# Patient Record
Sex: Male | Born: 1995 | Race: White | Hispanic: No | Marital: Single | Smoking: Current every day smoker
Health system: Southern US, Community
[De-identification: ages and names within clinical notes are randomized; demographics above are authoritative.]

## PROBLEM LIST (undated history)

## (undated) DIAGNOSIS — R51 Headache: Secondary | ICD-10-CM

## (undated) DIAGNOSIS — S36113A Laceration of liver, unspecified degree, initial encounter: Secondary | ICD-10-CM

## (undated) DIAGNOSIS — F99 Mental disorder, not otherwise specified: Secondary | ICD-10-CM

## (undated) DIAGNOSIS — H539 Unspecified visual disturbance: Secondary | ICD-10-CM

## (undated) DIAGNOSIS — J439 Emphysema, unspecified: Secondary | ICD-10-CM

## (undated) DIAGNOSIS — F902 Attention-deficit hyperactivity disorder, combined type: Secondary | ICD-10-CM

## (undated) DIAGNOSIS — R2241 Localized swelling, mass and lump, right lower limb: Secondary | ICD-10-CM

## (undated) DIAGNOSIS — Z973 Presence of spectacles and contact lenses: Secondary | ICD-10-CM

## (undated) DIAGNOSIS — F329 Major depressive disorder, single episode, unspecified: Secondary | ICD-10-CM

## (undated) DIAGNOSIS — F1910 Other psychoactive substance abuse, uncomplicated: Secondary | ICD-10-CM

## (undated) DIAGNOSIS — F32A Depression, unspecified: Secondary | ICD-10-CM

## (undated) DIAGNOSIS — S2619XA Other injury of heart without hemopericardium, initial encounter: Secondary | ICD-10-CM

## (undated) DIAGNOSIS — F1210 Cannabis abuse, uncomplicated: Secondary | ICD-10-CM

## (undated) DIAGNOSIS — T8484XA Pain due to internal orthopedic prosthetic devices, implants and grafts, initial encounter: Secondary | ICD-10-CM

## (undated) DIAGNOSIS — Z87820 Personal history of traumatic brain injury: Secondary | ICD-10-CM

## (undated) HISTORY — DX: Major depressive disorder, single episode, unspecified: F32.9

## (undated) HISTORY — PX: TONSILLECTOMY: SUR1361

## (undated) HISTORY — PX: ELBOW SURGERY: SHX618

## (undated) HISTORY — DX: Laceration of liver, unspecified degree, initial encounter: S36.113A

## (undated) HISTORY — DX: Mental disorder, not otherwise specified: F99

## (undated) HISTORY — PX: TYMPANOSTOMY TUBE PLACEMENT: SHX32

## (undated) HISTORY — DX: Depression, unspecified: F32.A

---
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
## 2013-02-21 ENCOUNTER — Emergency Department: Payer: Self-pay | Admitting: Emergency Medicine

## 2013-02-26 ENCOUNTER — Emergency Department: Payer: Self-pay | Admitting: Emergency Medicine

## 2013-05-05 ENCOUNTER — Emergency Department: Payer: Self-pay | Admitting: Emergency Medicine

## 2013-09-11 IMAGING — CT CT HEAD WITHOUT CONTRAST
2 series · 16 of 30 positions shown, 20 images · non-contrast
Comparison: none

REASON FOR EXAM: head injury
COMMENTS:

PROCEDURE:     CT  - CT HEAD WITHOUT CONTRAST  - January 08, 2012 [DATE]
RESULT:     Comparison:  12/20/2010
TECHNIQUE: Multiple axial images from the foramen magnum to the vertex were
obtained without IV contrast.

[Series 2: without · axial · non-contrast · 0.43mm/px · z∈[+262,+382]mm · 13 of 30 slices shown, 17 images]
[im 3/30  brain]
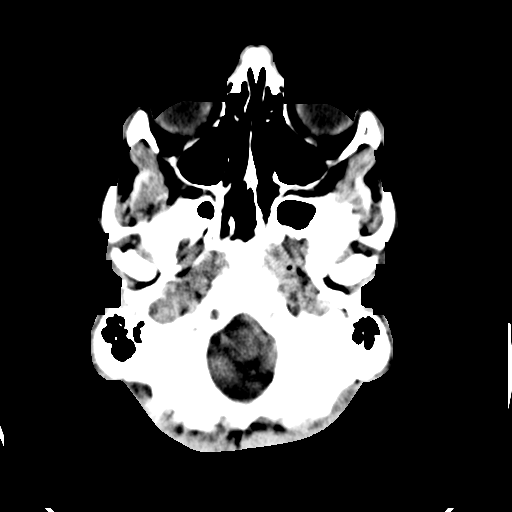
[im 3/30  bone]
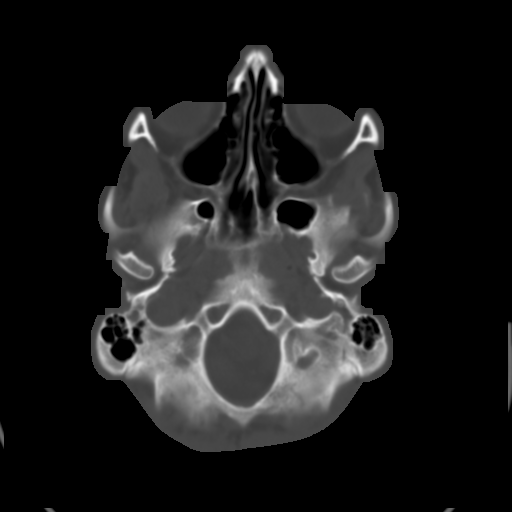
[im 5/30  brain]
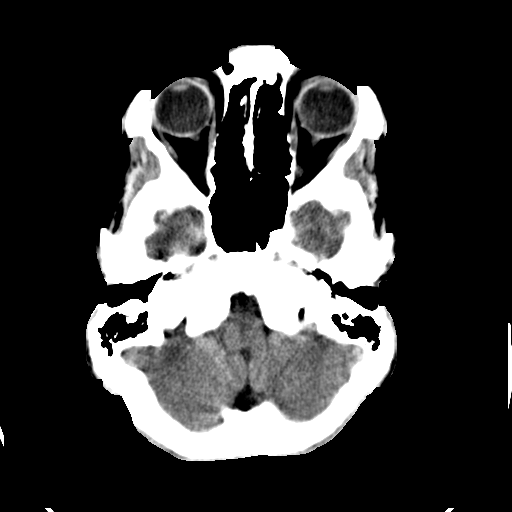
[im 7/30  brain]
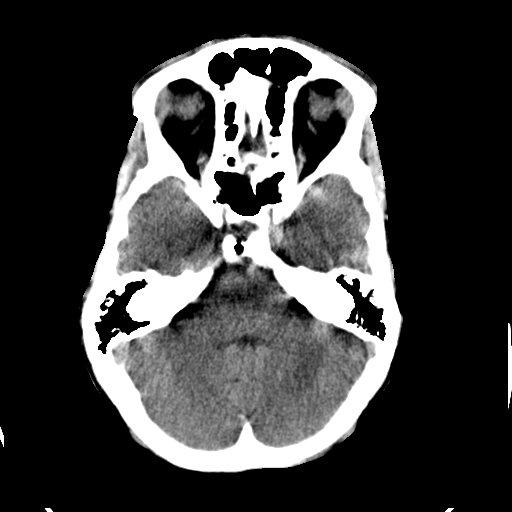
[im 9/30  brain]
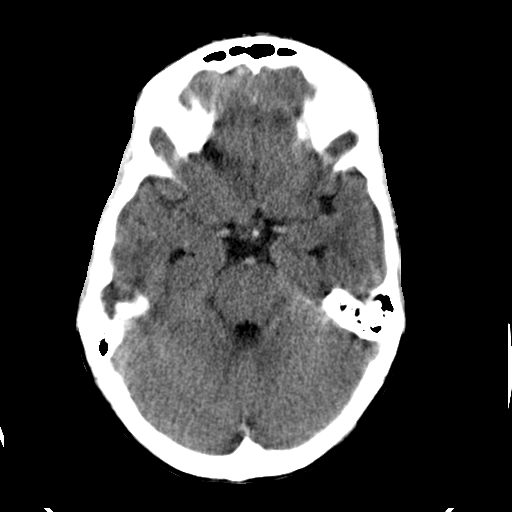
[im 11/30  brain]
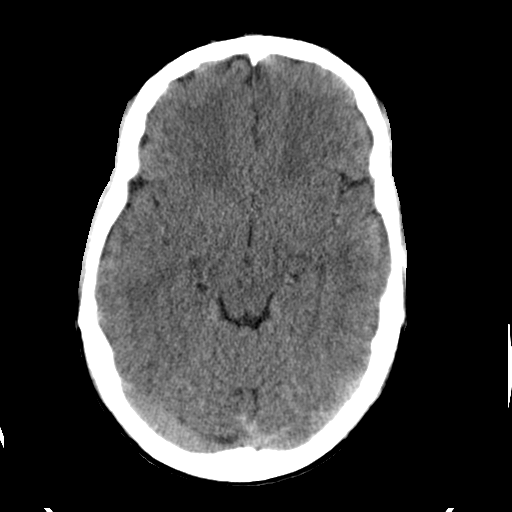
[im 11/30  bone]
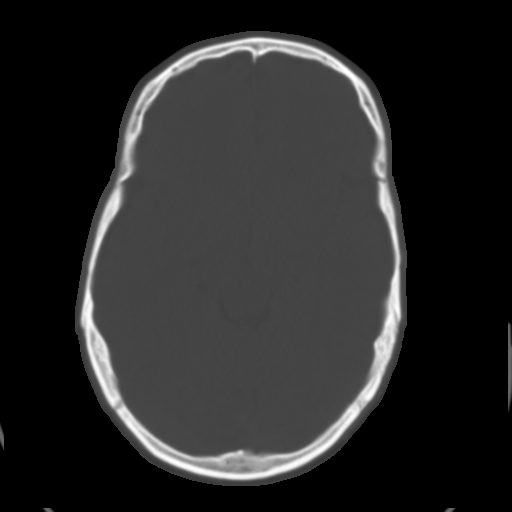
[im 13/30  brain]
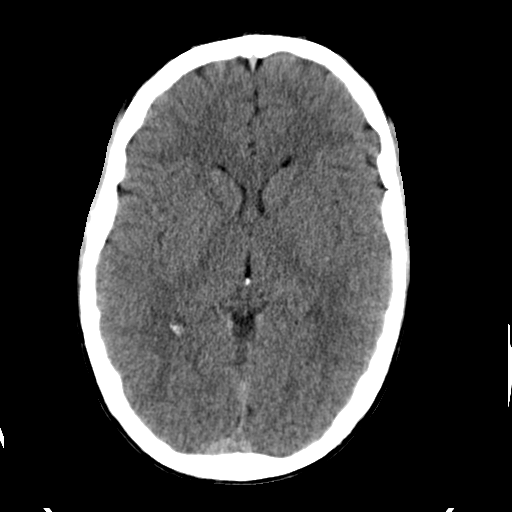
[im 15/30  brain]
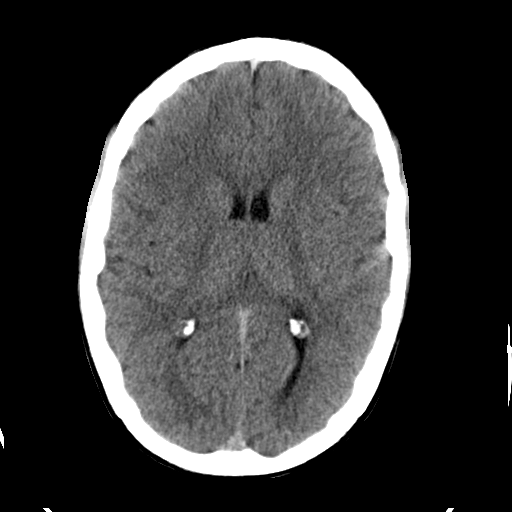
[im 17/30  brain]
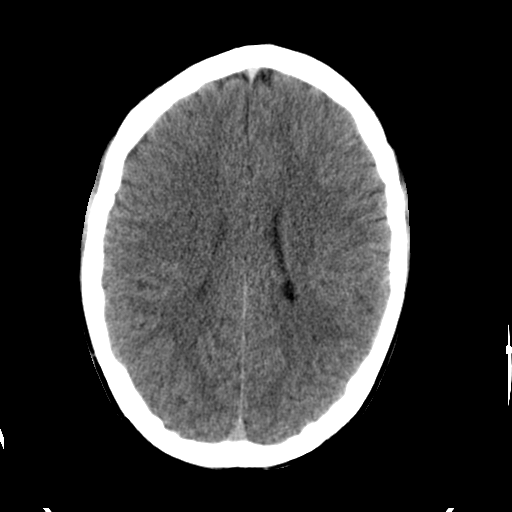
[im 19/30  brain]
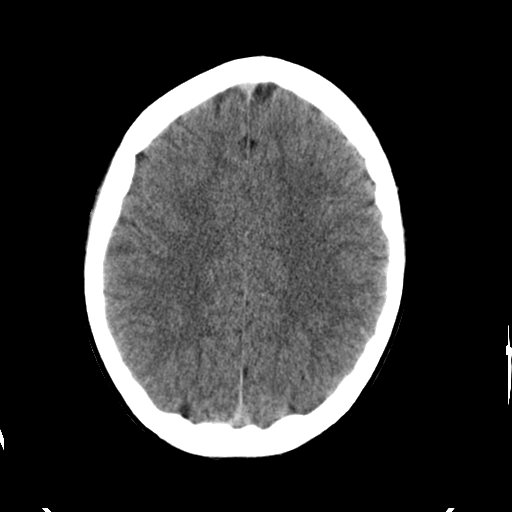
[im 19/30  bone]
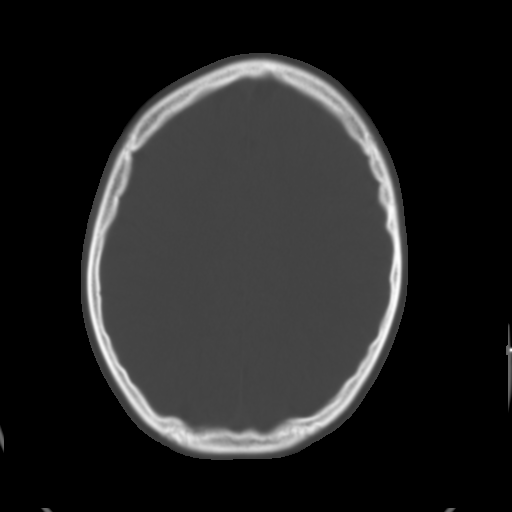
[im 21/30  brain]
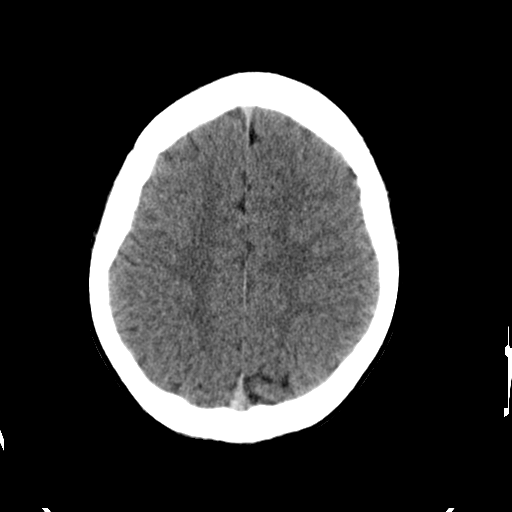
[im 23/30  brain]
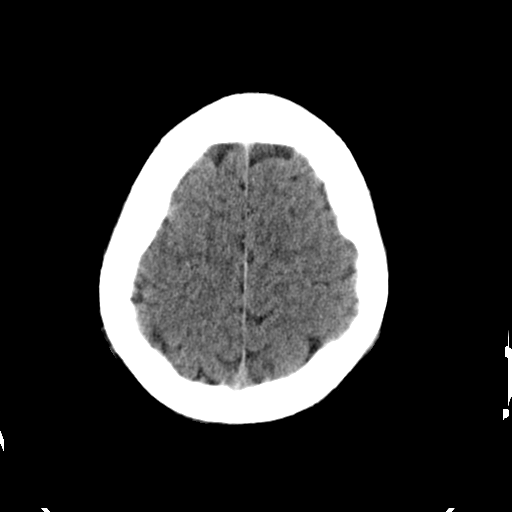
[im 25/30  brain]
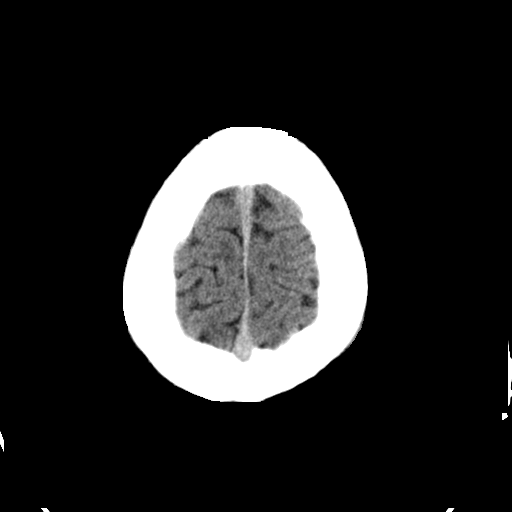
[im 27/30  brain]
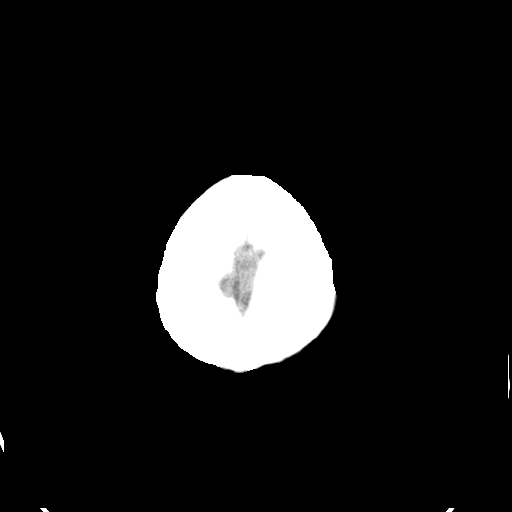
[im 27/30  bone]
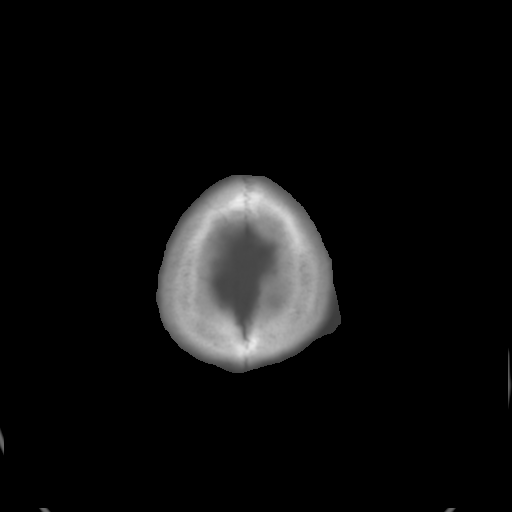

[Series 3: bone · axial · 0.43mm/px · z∈[+262,+302]mm · 3 of 30 slices shown]
[im 3/30  bone]
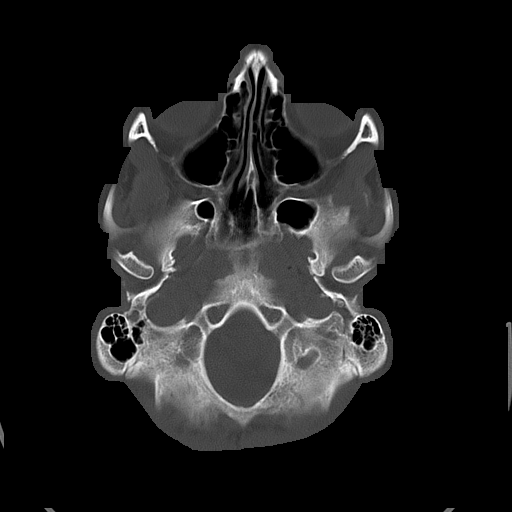
[im 7/30  bone]
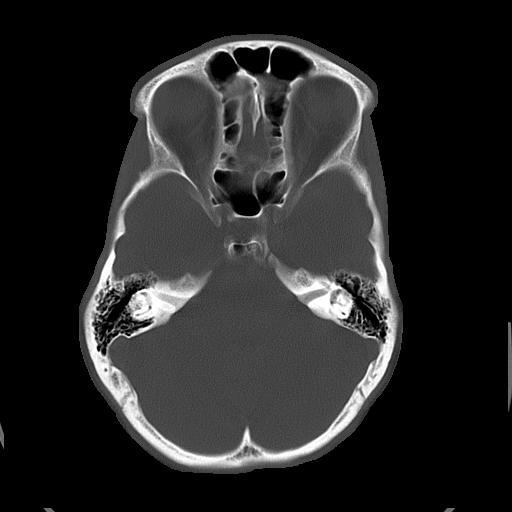
[im 11/30  bone]
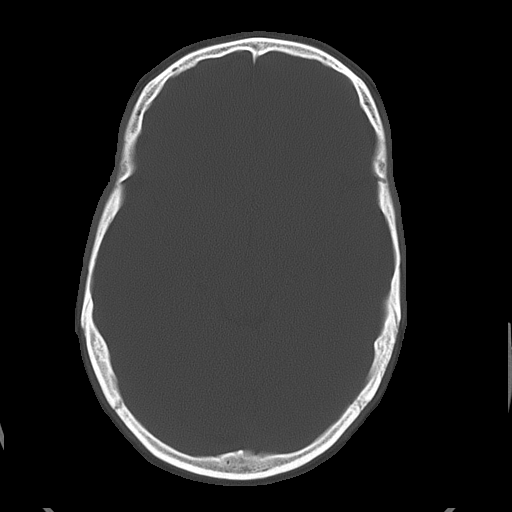

[16 of 30 positions shown; findings below may reference images not displayed]

FINDINGS: There is no evidence for mass effect, midline shift, or extra-axial fluid
collections. There is no evidence for space-occupying lesion, intracranial
hemorrhage, or cortical-based area of infarction.

The osseous structures are unremarkable.
IMPRESSION: No acute intracranial process.

## 2016-01-17 ENCOUNTER — Emergency Department
Admission: EM | Admit: 2016-01-17 | Discharge: 2016-01-17 | Disposition: A | Discharge status: Home / Self Care | Payer: Self-pay | Attending: Emergency Medicine | Admitting: Emergency Medicine

## 2016-01-17 ENCOUNTER — Encounter: Payer: Self-pay | Admitting: Emergency Medicine

## 2016-01-17 ENCOUNTER — Emergency Department: Payer: Self-pay

## 2016-01-17 DIAGNOSIS — M25572 Pain in left ankle and joints of left foot: Secondary | ICD-10-CM | POA: Insufficient documentation

## 2016-01-17 DIAGNOSIS — F902 Attention-deficit hyperactivity disorder, combined type: Secondary | ICD-10-CM | POA: Insufficient documentation

## 2016-01-17 DIAGNOSIS — Y929 Unspecified place or not applicable: Secondary | ICD-10-CM | POA: Insufficient documentation

## 2016-01-17 DIAGNOSIS — Y990 Civilian activity done for income or pay: Secondary | ICD-10-CM | POA: Insufficient documentation

## 2016-01-17 DIAGNOSIS — X501XXA Overexertion from prolonged static or awkward postures, initial encounter: Secondary | ICD-10-CM | POA: Insufficient documentation

## 2016-01-17 DIAGNOSIS — F17210 Nicotine dependence, cigarettes, uncomplicated: Secondary | ICD-10-CM | POA: Insufficient documentation

## 2016-01-17 DIAGNOSIS — F331 Major depressive disorder, recurrent, moderate: Secondary | ICD-10-CM | POA: Insufficient documentation

## 2016-01-17 DIAGNOSIS — Y9372 Activity, wrestling: Secondary | ICD-10-CM | POA: Insufficient documentation

## 2016-01-17 MED ORDER — NAPROXEN 500 MG PO TABS: 500.0000 mg | ORAL_TABLET | Freq: Two times a day (BID) | ORAL | Status: DC

## 2016-01-17 NOTE — ED Provider Notes (Signed)
[facility] Emergency Department Provider Note  ____________________________________________  Time seen: Approximately 9:35 AM  I have reviewed the triage vital signs and the nursing notes.   HISTORY  Chief Complaint Ankle Pain    HPI Mathew Herrera is a 20 y.o. male presents emergency room for evaluation of left ankle pain. Patient reports that he was wrestling yesterday and twisted his left ankle. Describes pain as minimal to over 10 nonradiating. Essentially, patient was noticed limping at work and his supervisor sent him over here for evaluation and medical clearance to return to work.   Past Medical History  Diagnosis Date  . Mental disorder   . Depression   . Headache(784.0)   . Vision abnormalities   . Attention deficit hyperactivity disorder, combined type 01/08/2012    Patient Active Problem List   Diagnosis Date Noted  . Moderate recurrent major depression (HCC) 01/08/2012  . Oppositional defiant disorder 01/08/2012  . Attention deficit hyperactivity disorder, combined type 01/08/2012  . Cannabis dependence (HCC) 01/08/2012  . Polysubstance abuse 01/08/2012    Past Surgical History  Procedure Laterality Date  . Elbow surgery  age 1    left    Current Outpatient Rx  Name  Route  Sig  Dispense  Refill  . naproxen (NAPROSYN) 500 MG tablet   Oral   Take 1 tablet (500 mg total) by mouth 2 (two) times daily with a meal.   60 tablet   0     Allergies Review of patient's allergies indicates no known allergies.  Family History  Problem Relation Age of Onset  . Depression Mother     Social History Social History  Substance Use Topics  . Smoking status: Current Every Day Smoker -- 1.00 packs/day for 4 years    Types: Cigarettes  . Smokeless tobacco: None     Comment: Plans to remain abstinent after discharge  . Alcohol Use: 12.0 oz/week    20 Shots of liquor per week     Comment: occasional    Review of  Systems Constitutional: No fever/chills Cardiovascular: Denies chest pain. Respiratory: Denies shortness of breath. Musculoskeletal: Negative for back pain. Skin: Negative for rash. Neurological: Negative for headaches, focal weakness or numbness.  10-point ROS otherwise negative.  ____________________________________________   PHYSICAL EXAM:  VITAL SIGNS: ED Triage Vitals  Enc Vitals Group     BP 01/17/16 0932 158/71 mmHg     Pulse Rate 01/17/16 0932 61     Resp 01/17/16 0932 16     Temp 01/17/16 0932 97.9 F (36.6 C)     Temp Source 01/17/16 0932 Oral     SpO2 01/17/16 0932 99 %     Weight 01/17/16 0932 145 lb (65.772 kg)     Height 01/17/16 0932 6' (1.829 m)     Head Cir --      Peak Flow --      Pain Score 01/17/16 0933 2     Pain Loc --      Pain Edu? --      Excl. in GC? --     Constitutional: Alert and oriented. Well appearing and in no acute distress.   Cardiovascular: Normal rate, regular rhythm. Grossly normal heart sounds.  Good peripheral circulation. Respiratory: Normal respiratory effort.  No retractions. Lungs CTAB. Musculoskeletal: No lower extremity tenderness nor edema.  No joint effusions. Neurologic:  Normal speech and language. No gross focal neurologic deficits are appreciated. No gait instability. Skin:  Skin is warm, dry  and intact. No rash noted. Psychiatric: Mood and affect are normal. Speech and behavior are normal.  ____________________________________________   LABS (all labs ordered are listed, but only abnormal results are displayed)  Labs Reviewed - No data to display ____________________________________________  EKG   ____________________________________________  RADIOLOGY  No acute osseous findings. ____________________________________________   PROCEDURES  Procedure(s) performed: None  Critical Care performed: No  ____________________________________________   INITIAL IMPRESSION / ASSESSMENT AND PLAN / ED  COURSE  Pertinent labs & imaging results that were available during my care of the patient were reviewed by me and considered in my medical decision making (see chart for details).  Left ankle pain and swelling. Patient discharged back to work with work note to justify his ability to work. No restrictions return to work as normal. Patient voices no other emergency medical complaints and was given a prescription for Naprosyn to take as needed for pain or swelling. ____________________________________________   FINAL CLINICAL IMPRESSION(S) / ED DIAGNOSES  Final diagnoses:  Left ankle pain     This chart was dictated using voice recognition software/Dragon. Despite best efforts to proofread, errors can occur which can change the meaning. Any change was purely unintentional.   Evangeline Dakinharles M Beers, PA-C 01/17/16 1052  Charlynne Panderavid Hsienta Yao, MD 01/17/16 [phone]

## 2016-01-17 NOTE — ED Notes (Signed)
Reports wrestling yesterday and twisted left ankle

## 2016-01-17 NOTE — Discharge Instructions (Signed)
Ankle Pain °Ankle pain is a common symptom. The bones, cartilage, tendons, and muscles of the ankle joint perform a lot of work each day. The ankle joint holds your body weight and allows you to move around. Ankle pain can occur on either side or back of 1 or both ankles. Ankle pain may be sharp and burning or dull and aching. There may be tenderness, stiffness, redness, or warmth around the ankle. The pain occurs more often when a person walks or puts pressure on the ankle. °CAUSES  °There are many reasons ankle pain can develop. It is important to work with your caregiver to identify the cause since many conditions can impact the bones, cartilage, muscles, and tendons. Causes for ankle pain include: °· Injury, including a break (fracture), sprain, or strain often due to a fall, sports, or a high-impact activity. °· Swelling (inflammation) of a tendon (tendonitis). °· Achilles tendon rupture. °· Ankle instability after repeated sprains and strains. °· Poor foot alignment. °· Pressure on a nerve (tarsal tunnel syndrome). °· Arthritis in the ankle or the lining of the ankle. °· Crystal formation in the ankle (gout or pseudogout). °DIAGNOSIS  °A diagnosis is based on your medical history, your symptoms, results of your physical exam, and results of diagnostic tests. Diagnostic tests may include X-ray exams or a computerized magnetic scan (magnetic resonance imaging, MRI). °TREATMENT  °Treatment will depend on the cause of your ankle pain and may include: °· Keeping pressure off the ankle and limiting activities. °· Using crutches or other walking support (a cane or brace). °· Using rest, ice, compression, and elevation. °· Participating in physical therapy or home exercises. °· Wearing shoe inserts or special shoes. °· Losing weight. °· Taking medications to reduce pain or swelling or receiving an injection. °· Undergoing surgery. °HOME CARE INSTRUCTIONS  °· Only take over-the-counter or prescription medicines for  pain, discomfort, or fever as directed by your caregiver. °· Put ice on the injured area. °· Put ice in a plastic bag. °· Place a towel between your skin and the bag. °· Leave the ice on for 15-20 minutes at a time, 03-04 times a day. °· Keep your leg raised (elevated) when possible to lessen swelling. °· Avoid activities that cause ankle pain. °· Follow specific exercises as directed by your caregiver. °· Record how often you have ankle pain, the location of the pain, and what it feels like. This information may be helpful to you and your caregiver. °· Ask your caregiver about returning to work or sports and whether you should drive. °· Follow up with your caregiver for further examination, therapy, or testing as directed. °SEEK MEDICAL CARE IF:  °· Pain or swelling continues or worsens beyond 1 week. °· You have an oral temperature above 102° F (38.9° C). °· You are feeling unwell or have chills. °· You are having an increasingly difficult time with walking. °· You have loss of sensation or other new symptoms. °· You have questions or concerns. °MAKE SURE YOU:  °· Understand these instructions. °· Will watch your condition. °· Will get help right away if you are not doing well or get worse. °  °This information is not intended to replace advice given to you by your health care provider. Make sure you discuss any questions you have with your health care provider. °  °Document Released: 12/06/2009 Document Revised: 09/10/2011 Document Reviewed: 01/18/2015 °Elsevier Interactive Patient Education ©2016 [employer] ° °Cryotherapy °Cryotherapy is when you put ice on   your injury. Ice helps lessen pain and puffiness (swelling) after an injury. Ice works the best when you start using it in the first 24 to 48 hours after an injury. °HOME CARE °· Put a dry or damp towel between the ice pack and your skin. °· You may press gently on the ice pack. °· Leave the ice on for no more than 10 to 20 minutes at a time. °· Check your  skin after 5 minutes to make sure your skin is okay. °· Rest at least 20 minutes between ice pack uses. °· Stop using ice when your skin loses feeling (numbness). °· Do not use ice on someone who cannot tell you when it hurts. This includes small children and people with memory problems (dementia). °GET HELP RIGHT AWAY IF: °· You have white spots on your skin. °· Your skin turns blue or pale. °· Your skin feels waxy or hard. °· Your puffiness gets worse. °MAKE SURE YOU:  °· Understand these instructions. °· Will watch your condition. °· Will get help right away if you are not doing well or get worse. °  °This information is not intended to replace advice given to you by your health care provider. Make sure you discuss any questions you have with your health care provider. °  °Document Released: 12/05/2007 Document Revised: 09/10/2011 Document Reviewed: 02/08/2011 °Elsevier Interactive Patient Education ©2016 Elsevier Inc. ° °

## 2016-04-23 ENCOUNTER — Emergency Department: Payer: Self-pay

## 2016-04-23 ENCOUNTER — Emergency Department
Admission: EM | Admit: 2016-04-23 | Discharge: 2016-04-23 | Disposition: A | Discharge status: Home / Self Care | Payer: Self-pay | Attending: Emergency Medicine | Admitting: Emergency Medicine

## 2016-04-23 ENCOUNTER — Encounter: Payer: Self-pay | Admitting: Emergency Medicine

## 2016-04-23 DIAGNOSIS — Y999 Unspecified external cause status: Secondary | ICD-10-CM | POA: Insufficient documentation

## 2016-04-23 DIAGNOSIS — Y9289 Other specified places as the place of occurrence of the external cause: Secondary | ICD-10-CM | POA: Insufficient documentation

## 2016-04-23 DIAGNOSIS — F17210 Nicotine dependence, cigarettes, uncomplicated: Secondary | ICD-10-CM | POA: Insufficient documentation

## 2016-04-23 DIAGNOSIS — S0083XA Contusion of other part of head, initial encounter: Secondary | ICD-10-CM

## 2016-04-23 DIAGNOSIS — W01198A Fall on same level from slipping, tripping and stumbling with subsequent striking against other object, initial encounter: Secondary | ICD-10-CM | POA: Insufficient documentation

## 2016-04-23 DIAGNOSIS — Y9389 Activity, other specified: Secondary | ICD-10-CM | POA: Insufficient documentation

## 2016-04-23 DIAGNOSIS — S0181XA Laceration without foreign body of other part of head, initial encounter: Secondary | ICD-10-CM

## 2016-04-23 DIAGNOSIS — F909 Attention-deficit hyperactivity disorder, unspecified type: Secondary | ICD-10-CM | POA: Insufficient documentation

## 2016-04-23 DIAGNOSIS — S0541XA Penetrating wound of orbit with or without foreign body, right eye, initial encounter: Secondary | ICD-10-CM | POA: Insufficient documentation

## 2016-04-23 MED ORDER — LIDOCAINE-EPINEPHRINE (PF) 1 %-1:200000 IJ SOLN
INTRAMUSCULAR | Status: AC
  Filled 2016-04-23: qty 30

## 2016-04-23 MED ORDER — OXYCODONE-ACETAMINOPHEN 5-325 MG PO TABS
1.0000 | ORAL_TABLET | Freq: Once | ORAL | Status: AC
  Administered 2016-04-23: 1 via ORAL
  Filled 2016-04-23: qty 1

## 2016-04-23 MED ORDER — IBUPROFEN 600 MG PO TABS
600.0000 mg | ORAL_TABLET | Freq: Once | ORAL | Status: AC
  Administered 2016-04-23: 600 mg via ORAL
  Filled 2016-04-23: qty 1

## 2016-04-23 MED ORDER — OXYCODONE-ACETAMINOPHEN 5-325 MG PO TABS: 1.0000 | ORAL_TABLET | Freq: Four times a day (QID) | ORAL | 0 refills | Status: DC | PRN

## 2016-04-23 NOTE — ED Provider Notes (Signed)
[facility] Emergency Department Provider Note   ____________________________________________   First MD Initiated Contact with Patient 04/23/16 1225     (approximate)  I have reviewed the triage vital signs and the nursing notes.   HISTORY  Chief Complaint Facial Injury    HPI Mathew Herrera is a 20 y.o. male patient percent with a laceration and facial contusion secondary to a slip and fall. Patient denies loss of consciousness. Patient lacerations as above the right eyebrow and right inferior periorbital area. Edema is apparent right inferior orbital area. Patient rates his pain as a 7/10. No palliative measures prior to arrival.  Past Medical History:  Diagnosis Date  . Attention deficit hyperactivity disorder, combined type 01/08/2012  . Depression   . Headache(784.0)   . Mental disorder   . Vision abnormalities     Patient Active Problem List   Diagnosis Date Noted  . Moderate recurrent major depression (HCC) 01/08/2012  . Oppositional defiant disorder 01/08/2012  . Attention deficit hyperactivity disorder, combined type 01/08/2012  . Cannabis dependence (HCC) 01/08/2012  . Polysubstance abuse 01/08/2012    Past Surgical History:  Procedure Laterality Date  . ELBOW SURGERY  age 20   left    Prior to Admission medications   Medication Sig Start Date End Date Taking? Authorizing Provider  naproxen (NAPROSYN) 500 MG tablet Take 1 tablet (500 mg total) by mouth 2 (two) times daily with a meal. 01/17/16   Evangeline Dakinharles M Beers, PA-C  oxyCODONE-acetaminophen (ROXICET) 5-325 MG tablet Take 1 tablet by mouth every 6 (six) hours as needed for severe pain. 04/23/16   Joni Reiningonald K Smith, PA-C    Allergies Review of patient's allergies indicates no known allergies.  Family History  Problem Relation Age of Onset  . Depression Mother     Social History Social History  Substance Use Topics  . Smoking status: Current Every Day Smoker    Packs/day: 1.00     Years: 4.00    Types: Cigarettes  . Smokeless tobacco: Never Used     Comment: Plans to remain abstinent after discharge  . Alcohol use 12.0 oz/week    20 Shots of liquor per week     Comment: occasional    Review of Systems Constitutional: No fever/chills Eyes: No visual changes. ENT: No sore throat. Cardiovascular: Denies chest pain. Respiratory: Denies shortness of breath. Gastrointestinal: No abdominal pain.  No nausea, no vomiting.  No diarrhea.  No constipation. Genitourinary: Negative for dysuria. Musculoskeletal: Negative for back pain. Skin: Negative for rash. Neurological: Positive for headaches, but denies focal weakness or numbness. Psychiatric:ADHD  ____________________________________________   PHYSICAL EXAM:  VITAL SIGNS: ED Triage Vitals [04/23/16 1202]  Enc Vitals Group     BP      Pulse      Resp      Temp      Temp src      SpO2      Weight 152 lb (68.9 kg)     Height 5\' 9"  (1.753 m)     Head Circumference      Peak Flow      Pain Score 7     Pain Loc      Pain Edu?      Excl. in GC?     Constitutional: Alert and oriented. Well appearing and in no acute distress. Eyes: Conjunctivae are normal. PERRL. EOMI. Head: Atraumatic. Nose: No congestion/rhinnorhea. Mouth/Throat: Mucous membranes are moist.  Oropharynx non-erythematous. Neck: No stridor.  No cervical spine tenderness to palpation. Hematological/Lymphatic/Immunilogical: No cervical lymphadenopathy. Cardiovascular: Normal rate, regular rhythm. Grossly normal heart sounds.  Good peripheral circulation. Respiratory: Normal respiratory effort.  No retractions. Lungs CTAB. Gastrointestinal: Soft and nontender. No distention. No abdominal bruits. No CVA tenderness. Musculoskeletal: No lower extremity tenderness nor edema.  No joint effusions. Neurologic:  Normal speech and language. No gross focal neurologic deficits are appreciated. No gait instability. Skin:  Skin is warm, dry and  intact. No rash noted. Psychiatric: Mood and affect are normal. Speech and behavior are normal.  ____________________________________________   LABS (all labs ordered are listed, but only abnormal results are displayed)  Labs Reviewed - No data to display ____________________________________________  EKG   ____________________________________________  RADIOLOGY  No fractures per x-ray and CT of the face. ____________________________________________   PROCEDURES  Procedure(s) performed: LACERATION REPAIR Performed by: Joni Reining Authorized by: Joni Reining Consent: Verbal consent obtained. Risks and benefits: risks, benefits and alternatives were discussed Consent given by: patient Patient identity confirmed: provided demographic data Prepped and Draped in normal sterile fashion Wound explored  Laceration Location: Supraobital  and inferior right orbital area.  Laceration Length: First laceration 2 cm and second laceration 2.5 cm  No Foreign Bodies seen or palpated  Anesthesia: local infiltration  Local anesthetic: lidocaine 1% with epinephrine  Anesthetic total: 6 ml  Irrigation method: syringe Amount of cleaning: standard  Skin closure: 5-0 nylon   Number of sutures: Super orbital laceration required 7 sutures and inferior orbital laceration required 6 sutures. Technique: Interrupted   Patient tolerance: Patient tolerated the procedure well with no immediate complications.    Procedures  Critical Care performed: No  ____________________________________________   INITIAL IMPRESSION / ASSESSMENT AND PLAN / ED COURSE  Pertinent labs & imaging results that were available during my care of the patient were reviewed by me and considered in my medical decision making (see chart for details). Facial contusion or laceration. Discuss CT and x-ray results with patient. Patient given discharge care instructions. Patient get a prescription for Percocets.  Patient advised return in 5 days for suture removal.  Clinical Course     ____________________________________________   FINAL CLINICAL IMPRESSION(S) / ED DIAGNOSES  Final diagnoses:  Facial laceration, initial encounter  Facial contusion, initial encounter      NEW MEDICATIONS STARTED DURING THIS VISIT:  New Prescriptions   OXYCODONE-ACETAMINOPHEN (ROXICET) 5-325 MG TABLET    Take 1 tablet by mouth every 6 (six) hours as needed for severe pain.     Note:  This document was prepared using Dragon voice recognition software and may include unintentional dictation errors.    Joni Reining, PA-C 04/23/16 1415    Minna Antis, MD 04/23/16 [phone]

## 2016-04-23 NOTE — ED Triage Notes (Signed)
First Nurse:  Patient slipped on a rug at home and fell into counter top hitting above and below right eye.  Lacerations to right cheek bone and right eye lid.  Bleeding controlled.  Denies LOC.. Bleeding controlled.

## 2016-04-23 NOTE — ED Notes (Signed)
See triage note  States he fell about 30 mins PTA and hit face on counter  No LOC lacerations noted to right cheek bone and eyelid

## 2019-12-07 ENCOUNTER — Encounter: Payer: Self-pay | Admitting: Emergency Medicine

## 2019-12-07 ENCOUNTER — Other Ambulatory Visit: Payer: Self-pay

## 2019-12-07 DIAGNOSIS — K0889 Other specified disorders of teeth and supporting structures: Secondary | ICD-10-CM | POA: Insufficient documentation

## 2019-12-07 DIAGNOSIS — F17210 Nicotine dependence, cigarettes, uncomplicated: Secondary | ICD-10-CM | POA: Insufficient documentation

## 2019-12-07 DIAGNOSIS — Z79899 Other long term (current) drug therapy: Secondary | ICD-10-CM | POA: Insufficient documentation

## 2019-12-07 NOTE — Telephone Encounter (Signed)
This note was not done by me and I am unable to address it further.

## 2019-12-07 NOTE — ED Triage Notes (Signed)
Patient ambulatory to triage with steady gait, without difficulty or distress noted, pt reports rt sided dental pain x 2wks

## 2019-12-08 ENCOUNTER — Emergency Department
Admission: EM | Admit: 2019-12-08 | Discharge: 2019-12-08 | Disposition: A | Discharge status: Home / Self Care | Payer: Self-pay | Attending: Emergency Medicine | Admitting: Emergency Medicine

## 2019-12-08 DIAGNOSIS — K0889 Other specified disorders of teeth and supporting structures: Secondary | ICD-10-CM

## 2019-12-08 MED ORDER — HYDROCODONE-ACETAMINOPHEN 5-325 MG PO TABS: 1.0000 | ORAL_TABLET | ORAL | 0 refills | Status: AC | PRN

## 2019-12-08 MED ORDER — AMOXICILLIN-POT CLAVULANATE 875-125 MG PO TABS: 1.0000 | ORAL_TABLET | Freq: Two times a day (BID) | ORAL | 0 refills | Status: AC

## 2019-12-08 MED ORDER — IBUPROFEN 600 MG PO TABS: 600.0000 mg | ORAL_TABLET | Freq: Four times a day (QID) | ORAL | 0 refills | Status: DC | PRN

## 2019-12-08 NOTE — ED Provider Notes (Signed)
[facility] Emergency Department Provider Note ____________________________________________   First MD Initiated Contact with Patient 12/08/19 0310     (approximate)  I have reviewed the triage vital signs and the nursing notes.   HISTORY  Chief Complaint Dental Pain    HPI Mathew Herrera is a 24 y.o. male with PMH as noted below who presents with dental pain over the last 2 weeks, gradual onset, and mainly localized to 2 teeth on the right side, both molars, 1 upper and 1 lower.  The patient denies any swelling, fever chills, or pus drainage.  Past Medical History:  Diagnosis Date  . Attention deficit hyperactivity disorder, combined type 01/08/2012  . Depression   . Headache(784.0)   . Mental disorder   . Vision abnormalities     Patient Active Problem List   Diagnosis Date Noted  . Moderate recurrent major depression ([place]) 01/08/2012  . Oppositional defiant disorder 01/08/2012  . Attention deficit hyperactivity disorder, combined type 01/08/2012  . Cannabis dependence ([place]) 01/08/2012  . Polysubstance abuse ([place]) 01/08/2012    Past Surgical History:  Procedure Laterality Date  . ELBOW SURGERY  age 49   left    Prior to Admission medications   Medication Sig Start Date End Date Taking? Authorizing Provider  amoxicillin-clavulanate (AUGMENTIN) 875-125 MG tablet Take 1 tablet by mouth 2 (two) times daily for 7 days. 12/08/19 12/15/19  Arta Silence, MD  HYDROcodone-acetaminophen (NORCO/VICODIN) 5-325 MG tablet Take 1 tablet by mouth every 4 (four) hours as needed for up to 5 days for severe pain. 12/08/19 12/13/19  Arta Silence, MD  ibuprofen (ADVIL) 600 MG tablet Take 1 tablet (600 mg total) by mouth every 6 (six) hours as needed. 12/08/19   Arta Silence, MD  naproxen (NAPROSYN) 500 MG tablet Take 1 tablet (500 mg total) by mouth 2 (two) times daily with a meal. 01/17/16   Beers, Pierce Crane, PA-C  oxyCODONE-acetaminophen (ROXICET)  5-325 MG tablet Take 1 tablet by mouth every 6 (six) hours as needed for severe pain. 04/23/16   Sable Feil, PA-C  buPROPion (WELLBUTRIN XL) 300 MG 24 hr tablet Take 1 tablet (300 mg total) by mouth daily. 01/14/12 01/17/16  Winson, Manus Rudd, NP  QUEtiapine (SEROQUEL) 100 MG tablet Take 1 tablet (100 mg total) by mouth at bedtime. 01/14/12 01/17/16  Aurelio Jew, NP    Allergies Patient has no known allergies.  Family History  Problem Relation Age of Onset  . Depression Mother     Social History Social History   Tobacco Use  . Smoking status: Current Every Day Smoker    Packs/day: 1.00    Years: 4.00    Pack years: 4.00    Types: Cigarettes  . Smokeless tobacco: Never Used  . Tobacco comment: Plans to remain abstinent after discharge  Substance Use Topics  . Alcohol use: Yes    Alcohol/week: 20.0 standard drinks    Types: 20 Shots of liquor per week    Comment: occasional  . Drug use: Yes    Frequency: 7.0 times per week    Types: Cocaine, Marijuana, Methamphetamines, Amphetamines, Benzodiazepines, "Crack" cocaine, Hydrocodone, LSD, MDMA (Ecstacy), Psilocybin    Review of Systems  Constitutional: No fever. ENT: No sore throat. Gastrointestinal: No vomiting. Skin: Negative for rash. Neurological: Negative for headache.   ____________________________________________   PHYSICAL EXAM:  VITAL SIGNS: ED Triage Vitals  Enc Vitals Group     BP 12/07/19 2308 138/82     Pulse  Rate 12/07/19 2308 71     Resp 12/07/19 2308 18     Temp 12/07/19 2308 97.9 F (36.6 C)     Temp Source 12/07/19 2308 Oral     SpO2 12/07/19 2308 100 %     Weight 12/07/19 2320 153 lb (69.4 kg)     Height 12/07/19 2320 5\' 10"  (1.778 m)     Head Circumference --      Peak Flow --      Pain Score 12/07/19 2320 3     Pain Loc --      Pain Edu? --      Excl. in GC? --     Constitutional: Alert and oriented. Well appearing and in no acute distress. Eyes: Conjunctivae are normal.  Head:  Atraumatic. Nose: No congestion/rhinnorhea. Mouth/Throat: Mucous membranes are moist.  Oropharynx clear.  1 upper and 1 lower molar on the right with visible cavities.  No surrounding erythema or fluctuance.  No drainage. Neck: Normal range of motion.  Cardiovascular: Normal rate, regular rhythm. Good peripheral circulation. Respiratory: Normal respiratory effort.  No retractions. Gastrointestinal: No distention.  Musculoskeletal: Extremities warm and well perfused.  Neurologic:  Normal speech and language. No gross focal neurologic deficits are appreciated.  Skin:  Skin is warm and dry. No rash noted. Psychiatric: Mood and affect are normal. Speech and behavior are normal.  ____________________________________________   LABS (all labs ordered are listed, but only abnormal results are displayed)  Labs Reviewed - No data to display ____________________________________________  EKG   ____________________________________________  RADIOLOGY    ____________________________________________   PROCEDURES  Procedure(s) performed: No  Procedures  Critical Care performed: No ____________________________________________   INITIAL IMPRESSION / ASSESSMENT AND PLAN / ED COURSE  Pertinent labs & imaging results that were available during my care of the patient were reviewed by me and considered in my medical decision making (see chart for details).  24 year old male with PMH as noted above presents with dental pain, affecting to molars on the right side.  He has visible cavities there.  On exam he is overall well-appearing with normal vital signs.  He is afebrile.  The pain is subacute.  There is no evidence of abscess or indication for acute intervention in the ED.  He will need dental follow-up.  I will put him on Augmentin for possible developing dental infection and give pain medication.  Return precautions given, and he expresses  understanding.  ____________________________________________   FINAL CLINICAL IMPRESSION(S) / ED DIAGNOSES  Final diagnoses:  Pain, dental      NEW MEDICATIONS STARTED DURING THIS VISIT:  New Prescriptions   AMOXICILLIN-CLAVULANATE (AUGMENTIN) 875-125 MG TABLET    Take 1 tablet by mouth 2 (two) times daily for 7 days.   HYDROCODONE-ACETAMINOPHEN (NORCO/VICODIN) 5-325 MG TABLET    Take 1 tablet by mouth every 4 (four) hours as needed for up to 5 days for severe pain.   IBUPROFEN (ADVIL) 600 MG TABLET    Take 1 tablet (600 mg total) by mouth every 6 (six) hours as needed.     Note:  This document was prepared using Dragon voice recognition software and may include unintentional dictation errors.   22, MD 12/08/19 [phone]

## 2019-12-08 NOTE — Discharge Instructions (Signed)
Take the Augmentin as prescribed and finish the full 7-day course.  You should take ibuprofen 600 mg every 6 hours as needed for pain, and you may take the hydrocodone if needed for more severe breakthrough pain.  Follow-up with a dentist within the next week.  A list of dental clinics in the area has been provided.

## 2021-12-29 ENCOUNTER — Encounter (HOSPITAL_COMMUNITY): Payer: Self-pay

## 2021-12-29 ENCOUNTER — Inpatient Hospital Stay (HOSPITAL_COMMUNITY): Payer: Self-pay

## 2021-12-29 ENCOUNTER — Other Ambulatory Visit: Payer: Self-pay

## 2021-12-29 ENCOUNTER — Emergency Department (HOSPITAL_COMMUNITY): Payer: Self-pay

## 2021-12-29 ENCOUNTER — Inpatient Hospital Stay (HOSPITAL_COMMUNITY): Payer: Self-pay | Admitting: Anesthesiology

## 2021-12-29 ENCOUNTER — Inpatient Hospital Stay (HOSPITAL_COMMUNITY)
Admission: EM | Admit: 2021-12-29 | Discharge: 2022-01-01 | DRG: 958 | Disposition: A | Discharge status: Home / Self Care | Payer: Self-pay | Attending: General Surgery | Admitting: General Surgery

## 2021-12-29 ENCOUNTER — Inpatient Hospital Stay (HOSPITAL_COMMUNITY): Admission: EM | Disposition: A | Discharge status: Home / Self Care | Payer: Self-pay | Source: Home / Self Care

## 2021-12-29 DIAGNOSIS — Z789 Other specified health status: Secondary | ICD-10-CM

## 2021-12-29 DIAGNOSIS — F17210 Nicotine dependence, cigarettes, uncomplicated: Secondary | ICD-10-CM | POA: Diagnosis present

## 2021-12-29 DIAGNOSIS — S27322A Contusion of lung, bilateral, initial encounter: Secondary | ICD-10-CM | POA: Diagnosis present

## 2021-12-29 DIAGNOSIS — S12500A Unspecified displaced fracture of sixth cervical vertebra, initial encounter for closed fracture: Secondary | ICD-10-CM | POA: Diagnosis present

## 2021-12-29 DIAGNOSIS — S62101A Fracture of unspecified carpal bone, right wrist, initial encounter for closed fracture: Secondary | ICD-10-CM

## 2021-12-29 DIAGNOSIS — F1210 Cannabis abuse, uncomplicated: Secondary | ICD-10-CM | POA: Diagnosis present

## 2021-12-29 DIAGNOSIS — S52611A Displaced fracture of right ulna styloid process, initial encounter for closed fracture: Secondary | ICD-10-CM | POA: Diagnosis present

## 2021-12-29 DIAGNOSIS — F902 Attention-deficit hyperactivity disorder, combined type: Secondary | ICD-10-CM | POA: Diagnosis present

## 2021-12-29 DIAGNOSIS — F430 Acute stress reaction: Secondary | ICD-10-CM | POA: Diagnosis present

## 2021-12-29 DIAGNOSIS — S12690A Other displaced fracture of seventh cervical vertebra, initial encounter for closed fracture: Secondary | ICD-10-CM | POA: Diagnosis present

## 2021-12-29 DIAGNOSIS — S36112A Contusion of liver, initial encounter: Secondary | ICD-10-CM | POA: Diagnosis present

## 2021-12-29 DIAGNOSIS — S12600A Unspecified displaced fracture of seventh cervical vertebra, initial encounter for closed fracture: Secondary | ICD-10-CM

## 2021-12-29 DIAGNOSIS — S12290A Other displaced fracture of third cervical vertebra, initial encounter for closed fracture: Secondary | ICD-10-CM | POA: Diagnosis present

## 2021-12-29 DIAGNOSIS — Y92410 Unspecified street and highway as the place of occurrence of the external cause: Secondary | ICD-10-CM | POA: Diagnosis not present

## 2021-12-29 DIAGNOSIS — S52571A Other intraarticular fracture of lower end of right radius, initial encounter for closed fracture: Secondary | ICD-10-CM | POA: Diagnosis present

## 2021-12-29 DIAGNOSIS — S62102A Fracture of unspecified carpal bone, left wrist, initial encounter for closed fracture: Secondary | ICD-10-CM

## 2021-12-29 DIAGNOSIS — Z20822 Contact with and (suspected) exposure to covid-19: Secondary | ICD-10-CM | POA: Diagnosis present

## 2021-12-29 DIAGNOSIS — M6028 Foreign body granuloma of soft tissue, not elsewhere classified, other site: Secondary | ICD-10-CM | POA: Diagnosis present

## 2021-12-29 DIAGNOSIS — R45851 Suicidal ideations: Secondary | ICD-10-CM | POA: Diagnosis present

## 2021-12-29 DIAGNOSIS — Z1810 Retained metal fragments, unspecified: Secondary | ICD-10-CM

## 2021-12-29 DIAGNOSIS — S22031A Stable burst fracture of third thoracic vertebra, initial encounter for closed fracture: Secondary | ICD-10-CM | POA: Diagnosis present

## 2021-12-29 DIAGNOSIS — S129XXA Fracture of neck, unspecified, initial encounter: Secondary | ICD-10-CM

## 2021-12-29 DIAGNOSIS — J439 Emphysema, unspecified: Secondary | ICD-10-CM | POA: Diagnosis present

## 2021-12-29 DIAGNOSIS — S52502A Unspecified fracture of the lower end of left radius, initial encounter for closed fracture: Secondary | ICD-10-CM | POA: Diagnosis present

## 2021-12-29 DIAGNOSIS — Z818 Family history of other mental and behavioral disorders: Secondary | ICD-10-CM

## 2021-12-29 DIAGNOSIS — M532X2 Spinal instabilities, cervical region: Secondary | ICD-10-CM | POA: Diagnosis present

## 2021-12-29 DIAGNOSIS — S22009A Unspecified fracture of unspecified thoracic vertebra, initial encounter for closed fracture: Secondary | ICD-10-CM

## 2021-12-29 HISTORY — PX: ANTERIOR CERVICAL CORPECTOMY: SHX1159

## 2021-12-29 HISTORY — DX: Other specified health status: Z78.9

## 2021-12-29 LAB — CBC
HCT: 40 % (ref 39.0–52.0)
Hemoglobin: 14.1 g/dL (ref 13.0–17.0)
MCH: 33.1 pg (ref 26.0–34.0)
MCHC: 35.3 g/dL (ref 30.0–36.0)
MCV: 93.9 fL (ref 80.0–100.0)
Platelets: 189 10*3/uL (ref 150–400)
RBC: 4.26 MIL/uL (ref 4.22–5.81)
RDW: 11.6 % (ref 11.5–15.5)
WBC: 23.4 10*3/uL — ABNORMAL HIGH (ref 4.0–10.5)
nRBC: 0 % (ref 0.0–0.2)

## 2021-12-29 LAB — COMPREHENSIVE METABOLIC PANEL
ALT: 30 U/L (ref 0–44)
AST: 43 U/L — ABNORMAL HIGH (ref 15–41)
Albumin: 4.4 g/dL (ref 3.5–5.0)
Alkaline Phosphatase: 63 U/L (ref 38–126)
Anion gap: 9 (ref 5–15)
BUN: 17 mg/dL (ref 6–20)
CO2: 23 mmol/L (ref 22–32)
Calcium: 8.6 mg/dL — ABNORMAL LOW (ref 8.9–10.3)
Chloride: 106 mmol/L (ref 98–111)
Creatinine, Ser: 1.03 mg/dL (ref 0.61–1.24)
GFR, Estimated: >60 mL/min (ref >60)
Glucose, Bld: 94 mg/dL (ref 70–99)
Potassium: 4.2 mmol/L (ref 3.5–5.1)
Sodium: 138 mmol/L (ref 135–145)
Total Bilirubin: 1.4 mg/dL — ABNORMAL HIGH (ref 0.3–1.2)
Total Protein: 6.3 g/dL — ABNORMAL LOW (ref 6.5–8.1)

## 2021-12-29 LAB — URINALYSIS, ROUTINE W REFLEX MICROSCOPIC
Bilirubin Urine: NEGATIVE
Glucose, UA: NEGATIVE mg/dL
Ketones, ur: NEGATIVE mg/dL
Leukocytes,Ua: NEGATIVE
Nitrite: NEGATIVE
Protein, ur: NEGATIVE mg/dL
Specific Gravity, Urine: >1.046 — ABNORMAL HIGH (ref 1.005–1.030)
pH: 6 (ref 5.0–8.0)

## 2021-12-29 LAB — RAPID URINE DRUG SCREEN, HOSP PERFORMED
Amphetamines: NOT DETECTED
Barbiturates: NOT DETECTED
Benzodiazepines: POSITIVE — AB
Cocaine: NOT DETECTED
Opiates: POSITIVE — AB
Tetrahydrocannabinol: POSITIVE — AB

## 2021-12-29 LAB — I-STAT CHEM 8, ED
BUN: 23 mg/dL — ABNORMAL HIGH (ref 6–20)
Calcium, Ion: 1.1 mmol/L — ABNORMAL LOW (ref 1.15–1.40)
Chloride: 104 mmol/L (ref 98–111)
Creatinine, Ser: 0.9 mg/dL (ref 0.61–1.24)
Glucose, Bld: 96 mg/dL (ref 70–99)
HCT: 43 % (ref 39.0–52.0)
Hemoglobin: 14.6 g/dL (ref 13.0–17.0)
Potassium: 5.3 mmol/L — ABNORMAL HIGH (ref 3.5–5.1)
Sodium: 139 mmol/L (ref 135–145)
TCO2: 25 mmol/L (ref 22–32)

## 2021-12-29 LAB — PROTIME-INR
INR: 1.3 — ABNORMAL HIGH (ref 0.8–1.2)
Prothrombin Time: 15.9 seconds — ABNORMAL HIGH (ref 11.4–15.2)

## 2021-12-29 LAB — RESP PANEL BY RT-PCR (FLU A&B, COVID) ARPGX2
Influenza A by PCR: NEGATIVE
Influenza B by PCR: NEGATIVE
SARS Coronavirus 2 by RT PCR: NEGATIVE

## 2021-12-29 LAB — ETHANOL: Alcohol, Ethyl (B): <10 mg/dL (ref <10)

## 2021-12-29 LAB — LACTIC ACID, PLASMA: Lactic Acid, Venous: 0.9 mmol/L (ref 0.5–1.9)

## 2021-12-29 SURGERY — ANTERIOR CERVICAL CORPECTOMY
Anesthesia: General

## 2021-12-29 MED ORDER — GABAPENTIN 300 MG PO CAPS
300.0000 mg | ORAL_CAPSULE | Freq: Three times a day (TID) | ORAL | Status: DC
  Administered 2021-12-30 – 2022-01-01 (×7): 300 mg via ORAL
  Filled 2021-12-29 (×7): qty 1

## 2021-12-29 MED ORDER — ONDANSETRON HCL 4 MG/2ML IJ SOLN
4.0000 mg | Freq: Four times a day (QID) | INTRAMUSCULAR | Status: DC | PRN
  Administered 2021-12-31: 4 mg via INTRAVENOUS
  Filled 2021-12-29: qty 2

## 2021-12-29 MED ORDER — ACETAMINOPHEN 10 MG/ML IV SOLN: 1000.0000 mg | Freq: Once | INTRAVENOUS | Status: DC | PRN

## 2021-12-29 MED ORDER — SODIUM CHLORIDE 0.9 % IV SOLN
250.0000 mL | INTRAVENOUS | Status: DC
  Administered 2021-12-30: 250 mL via INTRAVENOUS

## 2021-12-29 MED ORDER — OXYCODONE HCL 5 MG PO TABS
5.0000 mg | ORAL_TABLET | ORAL | Status: DC | PRN
  Administered 2021-12-31 – 2022-01-01 (×5): 5 mg via ORAL
  Filled 2021-12-29 (×5): qty 1

## 2021-12-29 MED ORDER — FENTANYL CITRATE (PF) 100 MCG/2ML IJ SOLN: 25.0000 ug | INTRAMUSCULAR | Status: DC | PRN

## 2021-12-29 MED ORDER — PHENYLEPHRINE 80 MCG/ML (10ML) SYRINGE FOR IV PUSH (FOR BLOOD PRESSURE SUPPORT)
PREFILLED_SYRINGE | INTRAVENOUS | Status: AC
  Filled 2021-12-29: qty 10

## 2021-12-29 MED ORDER — ONDANSETRON HCL 4 MG/2ML IJ SOLN
INTRAMUSCULAR | Status: AC
  Filled 2021-12-29: qty 2

## 2021-12-29 MED ORDER — MORPHINE SULFATE (PF) 4 MG/ML IV SOLN
4.0000 mg | Freq: Once | INTRAVENOUS | Status: AC
  Administered 2021-12-29: 4 mg via INTRAVENOUS
  Filled 2021-12-29: qty 1

## 2021-12-29 MED ORDER — BISACODYL 10 MG RE SUPP: 10.0000 mg | Freq: Every day | RECTAL | Status: DC | PRN

## 2021-12-29 MED ORDER — KETAMINE HCL 50 MG/5ML IJ SOSY
PREFILLED_SYRINGE | INTRAMUSCULAR | Status: AC
Start: 2021-12-29 — End: ?
  Filled 2021-12-29: qty 5

## 2021-12-29 MED ORDER — DOCUSATE SODIUM 100 MG PO CAPS
100.0000 mg | ORAL_CAPSULE | Freq: Two times a day (BID) | ORAL | Status: DC
  Administered 2021-12-30 – 2022-01-01 (×3): 100 mg via ORAL
  Filled 2021-12-29 (×3): qty 1

## 2021-12-29 MED ORDER — PROPOFOL 10 MG/ML IV BOLUS
INTRAVENOUS | Status: AC
  Filled 2021-12-29: qty 20

## 2021-12-29 MED ORDER — MENTHOL 3 MG MT LOZG: 1.0000 | LOZENGE | OROMUCOSAL | Status: DC | PRN

## 2021-12-29 MED ORDER — MIDAZOLAM HCL 2 MG/2ML IJ SOLN
INTRAMUSCULAR | Status: AC
  Filled 2021-12-29: qty 2

## 2021-12-29 MED ORDER — BUPIVACAINE HCL (PF) 0.5 % IJ SOLN
INTRAMUSCULAR | Status: AC
  Filled 2021-12-29: qty 30

## 2021-12-29 MED ORDER — THROMBIN 5000 UNITS EX SOLR
CUTANEOUS | Status: AC
  Filled 2021-12-29: qty 10000

## 2021-12-29 MED ORDER — LIDOCAINE-EPINEPHRINE 1 %-1:100000 IJ SOLN
INTRAMUSCULAR | Status: AC
  Filled 2021-12-29: qty 1

## 2021-12-29 MED ORDER — SUCCINYLCHOLINE CHLORIDE 200 MG/10ML IV SOSY
PREFILLED_SYRINGE | INTRAVENOUS | Status: DC | PRN
  Administered 2021-12-29: 100 mg via INTRAVENOUS

## 2021-12-29 MED ORDER — THROMBIN 5000 UNITS EX SOLR
OROMUCOSAL | Status: DC | PRN
  Administered 2021-12-29 (×3): 5 mL via TOPICAL

## 2021-12-29 MED ORDER — CEFAZOLIN SODIUM-DEXTROSE 2-3 GM-%(50ML) IV SOLR
INTRAVENOUS | Status: DC | PRN
  Administered 2021-12-29: 2 g via INTRAVENOUS

## 2021-12-29 MED ORDER — SODIUM CHLORIDE 0.9 % IV SOLN
INTRAVENOUS | Status: DC
Start: 2021-12-29 — End: 2021-12-30

## 2021-12-29 MED ORDER — ONDANSETRON HCL 4 MG/2ML IJ SOLN
INTRAMUSCULAR | Status: DC | PRN
  Administered 2021-12-29: 4 mg via INTRAVENOUS

## 2021-12-29 MED ORDER — ACETAMINOPHEN 650 MG RE SUPP: 650.0000 mg | RECTAL | Status: DC | PRN

## 2021-12-29 MED ORDER — LACTATED RINGERS IV SOLN: INTRAVENOUS | Status: DC | PRN

## 2021-12-29 MED ORDER — HYDROMORPHONE HCL 1 MG/ML IJ SOLN
INTRAMUSCULAR | Status: AC
  Filled 2021-12-29: qty 0.5

## 2021-12-29 MED ORDER — METOPROLOL TARTRATE 5 MG/5ML IV SOLN
5.0000 mg | Freq: Four times a day (QID) | INTRAVENOUS | Status: DC | PRN
  Filled 2021-12-29: qty 5

## 2021-12-29 MED ORDER — DEXAMETHASONE SODIUM PHOSPHATE 10 MG/ML IJ SOLN
INTRAMUSCULAR | Status: AC
Start: 2021-12-29 — End: ?
  Filled 2021-12-29: qty 1

## 2021-12-29 MED ORDER — ACETAMINOPHEN 10 MG/ML IV SOLN
INTRAVENOUS | Status: AC
  Filled 2021-12-29: qty 100

## 2021-12-29 MED ORDER — FENTANYL CITRATE (PF) 250 MCG/5ML IJ SOLN
INTRAMUSCULAR | Status: DC | PRN
  Administered 2021-12-29: 100 ug via INTRAVENOUS
  Administered 2021-12-29 (×3): 50 ug via INTRAVENOUS

## 2021-12-29 MED ORDER — DEXAMETHASONE SODIUM PHOSPHATE 10 MG/ML IJ SOLN
INTRAMUSCULAR | Status: DC | PRN
  Administered 2021-12-29: 10 mg via INTRAVENOUS

## 2021-12-29 MED ORDER — ENOXAPARIN SODIUM 30 MG/0.3ML IJ SOSY: 30.0000 mg | PREFILLED_SYRINGE | Freq: Two times a day (BID) | INTRAMUSCULAR | Status: DC

## 2021-12-29 MED ORDER — SODIUM CHLORIDE 0.9% FLUSH
3.0000 mL | Freq: Two times a day (BID) | INTRAVENOUS | Status: DC
  Administered 2021-12-31 – 2022-01-01 (×3): 3 mL via INTRAVENOUS

## 2021-12-29 MED ORDER — MIDAZOLAM HCL 2 MG/2ML IJ SOLN
INTRAMUSCULAR | Status: DC | PRN
  Administered 2021-12-29: 2 mg via INTRAVENOUS

## 2021-12-29 MED ORDER — MIDAZOLAM HCL (PF) 5 MG/ML IJ SOLN
INTRAMUSCULAR | Status: AC
  Administered 2021-12-29: 2 mg
  Filled 2021-12-29: qty 1

## 2021-12-29 MED ORDER — PHENYLEPHRINE HCL-NACL 20-0.9 MG/250ML-% IV SOLN
INTRAVENOUS | Status: DC | PRN
  Administered 2021-12-29: 25 ug/min via INTRAVENOUS

## 2021-12-29 MED ORDER — ROCURONIUM BROMIDE 10 MG/ML (PF) SYRINGE
PREFILLED_SYRINGE | INTRAVENOUS | Status: AC
Start: 2021-12-29 — End: ?
  Filled 2021-12-29: qty 20

## 2021-12-29 MED ORDER — LIDOCAINE 2% (20 MG/ML) 5 ML SYRINGE
INTRAMUSCULAR | Status: DC | PRN
  Administered 2021-12-29: 60 mg via INTRAVENOUS

## 2021-12-29 MED ORDER — HYDRALAZINE HCL 20 MG/ML IJ SOLN: 10.0000 mg | INTRAMUSCULAR | Status: DC | PRN

## 2021-12-29 MED ORDER — ACETAMINOPHEN 500 MG PO TABS
1000.0000 mg | ORAL_TABLET | Freq: Four times a day (QID) | ORAL | Status: DC
  Administered 2021-12-30 – 2022-01-01 (×9): 1000 mg via ORAL
  Filled 2021-12-29 (×10): qty 2

## 2021-12-29 MED ORDER — IOHEXOL 350 MG/ML SOLN
100.0000 mL | Freq: Once | INTRAVENOUS | Status: AC | PRN
  Administered 2021-12-29: 100 mL via INTRAVENOUS

## 2021-12-29 MED ORDER — POTASSIUM CHLORIDE IN NACL 20-0.9 MEQ/L-% IV SOLN
INTRAVENOUS | Status: DC
  Filled 2021-12-29: qty 1000

## 2021-12-29 MED ORDER — SODIUM CHLORIDE 0.9 % IV SOLN: INTRAVENOUS | Status: DC

## 2021-12-29 MED ORDER — THROMBIN 20000 UNITS EX SOLR
CUTANEOUS | Status: AC
  Filled 2021-12-29: qty 20000

## 2021-12-29 MED ORDER — MIDAZOLAM HCL 2 MG/2ML IJ SOLN: 2.0000 mg | Freq: Once | INTRAMUSCULAR | Status: DC

## 2021-12-29 MED ORDER — HYDROMORPHONE HCL 1 MG/ML IJ SOLN: 2.0000 mg | Freq: Once | INTRAMUSCULAR | Status: AC

## 2021-12-29 MED ORDER — ACETAMINOPHEN 325 MG PO TABS: 650.0000 mg | ORAL_TABLET | ORAL | Status: DC | PRN

## 2021-12-29 MED ORDER — DEXMEDETOMIDINE (PRECEDEX) IN NS 20 MCG/5ML (4 MCG/ML) IV SYRINGE
PREFILLED_SYRINGE | INTRAVENOUS | Status: DC | PRN
  Administered 2021-12-29: 20 ug via INTRAVENOUS
  Administered 2021-12-29: 8 ug via INTRAVENOUS

## 2021-12-29 MED ORDER — KETAMINE HCL 10 MG/ML IJ SOLN
INTRAMUSCULAR | Status: DC | PRN
  Administered 2021-12-29: 20 mg via INTRAVENOUS
  Administered 2021-12-29: 30 mg via INTRAVENOUS

## 2021-12-29 MED ORDER — BUPIVACAINE HCL 0.5 % IJ SOLN
INTRAMUSCULAR | Status: DC | PRN
  Administered 2021-12-29: 3 mL

## 2021-12-29 MED ORDER — FENTANYL CITRATE (PF) 250 MCG/5ML IJ SOLN
INTRAMUSCULAR | Status: AC
  Filled 2021-12-29: qty 5

## 2021-12-29 MED ORDER — ONDANSETRON 4 MG PO TBDP: 4.0000 mg | ORAL_TABLET | Freq: Four times a day (QID) | ORAL | Status: DC | PRN

## 2021-12-29 MED ORDER — FENTANYL CITRATE (PF) 100 MCG/2ML IJ SOLN
INTRAMUSCULAR | Status: AC
  Filled 2021-12-29: qty 2

## 2021-12-29 MED ORDER — HYDROMORPHONE HCL 1 MG/ML IJ SOLN
INTRAMUSCULAR | Status: AC
  Administered 2021-12-29: 2 mg via INTRAVENOUS
  Filled 2021-12-29: qty 2

## 2021-12-29 MED ORDER — CEFAZOLIN SODIUM-DEXTROSE 2-4 GM/100ML-% IV SOLN
INTRAVENOUS | Status: AC
  Filled 2021-12-29: qty 100

## 2021-12-29 MED ORDER — HYDROMORPHONE HCL 1 MG/ML IJ SOLN
0.5000 mg | INTRAMUSCULAR | Status: DC | PRN
  Administered 2021-12-29: .5 mg via INTRAVENOUS
  Administered 2021-12-30 – 2021-12-31 (×5): 1 mg via INTRAVENOUS
  Filled 2021-12-29 (×6): qty 1

## 2021-12-29 MED ORDER — OXYCODONE HCL 5 MG PO TABS
10.0000 mg | ORAL_TABLET | ORAL | Status: DC | PRN
  Administered 2021-12-30: 10 mg via ORAL
  Filled 2021-12-29: qty 2

## 2021-12-29 MED ORDER — THROMBIN 5000 UNITS EX SOLR
CUTANEOUS | Status: AC
  Filled 2021-12-29: qty 5000

## 2021-12-29 MED ORDER — PHENOL 1.4 % MT LIQD: 1.0000 | OROMUCOSAL | Status: DC | PRN

## 2021-12-29 MED ORDER — METHOCARBAMOL 1000 MG/10ML IJ SOLN
500.0000 mg | Freq: Four times a day (QID) | INTRAVENOUS | Status: DC | PRN
  Filled 2021-12-29: qty 5

## 2021-12-29 MED ORDER — SUGAMMADEX SODIUM 200 MG/2ML IV SOLN
INTRAVENOUS | Status: DC | PRN
  Administered 2021-12-29: 200 mg via INTRAVENOUS

## 2021-12-29 MED ORDER — ACETAMINOPHEN 10 MG/ML IV SOLN
INTRAVENOUS | Status: DC | PRN
  Administered 2021-12-29: 1000 mg via INTRAVENOUS

## 2021-12-29 MED ORDER — ROCURONIUM BROMIDE 10 MG/ML (PF) SYRINGE
PREFILLED_SYRINGE | INTRAVENOUS | Status: DC | PRN
  Administered 2021-12-29: 50 mg via INTRAVENOUS
  Administered 2021-12-29: 100 mg via INTRAVENOUS

## 2021-12-29 MED ORDER — PROPOFOL 10 MG/ML IV BOLUS
INTRAVENOUS | Status: DC | PRN
  Administered 2021-12-29: 100 mg via INTRAVENOUS

## 2021-12-29 MED ORDER — CEFAZOLIN SODIUM-DEXTROSE 1-4 GM/50ML-% IV SOLN
1.0000 g | Freq: Three times a day (TID) | INTRAVENOUS | Status: DC
  Administered 2021-12-30 (×2): 1 g via INTRAVENOUS
  Filled 2021-12-29 (×3): qty 50

## 2021-12-29 MED ORDER — LIDOCAINE-EPINEPHRINE 1 %-1:100000 IJ SOLN
INTRAMUSCULAR | Status: DC | PRN
  Administered 2021-12-29: 3 mL via INTRADERMAL

## 2021-12-29 MED ORDER — SODIUM CHLORIDE 0.9 % IV BOLUS
1000.0000 mL | Freq: Once | INTRAVENOUS | Status: AC
  Administered 2021-12-29: 1000 mL via INTRAVENOUS

## 2021-12-29 MED ORDER — 0.9 % SODIUM CHLORIDE (POUR BTL) OPTIME
TOPICAL | Status: DC | PRN
  Administered 2021-12-29: 1000 mL

## 2021-12-29 MED ORDER — THROMBIN 20000 UNITS EX SOLR
CUTANEOUS | Status: DC | PRN
  Administered 2021-12-29: 20 mL via TOPICAL

## 2021-12-29 MED ORDER — MELATONIN 3 MG PO TABS
3.0000 mg | ORAL_TABLET | Freq: Every evening | ORAL | Status: DC | PRN
  Administered 2021-12-30 (×2): 3 mg via ORAL
  Filled 2021-12-29 (×2): qty 1

## 2021-12-29 MED ORDER — SODIUM CHLORIDE 0.9% FLUSH: 3.0000 mL | INTRAVENOUS | Status: DC | PRN

## 2021-12-29 SURGICAL SUPPLY — 73 items
APL SKNCLS STERI-STRIP NONHPOA (GAUZE/BANDAGES/DRESSINGS) ×1
BAG COUNTER SPONGE SURGICOUNT (BAG) ×3 IMPLANT
BAG SPNG CNTER NS LX DISP (BAG) ×1
BAND INSRT 18 STRL LF DISP RB (MISCELLANEOUS) ×2
BAND RUBBER #18 3X1/16 STRL (MISCELLANEOUS) ×6 IMPLANT
BASKET BONE COLLECTION (BASKET) IMPLANT
BENZOIN TINCTURE PRP APPL 2/3 (GAUZE/BANDAGES/DRESSINGS) ×3 IMPLANT
BIT DRILL NEURO 2X3.1 SFT TUCH (MISCELLANEOUS) ×2 IMPLANT
BIT DRILL OZARK SU 2.3X16 (DRILL) IMPLANT
BLADE CLIPPER SURG (BLADE) IMPLANT
BLADE SURG 15 STRL LF DISP TIS (BLADE) IMPLANT
BLADE SURG 15 STRL SS (BLADE)
BLADE ULTRA TIP 2M (BLADE) IMPLANT
BUR CUTTER 5.0 COARSE DIAMOND (BURR) ×1 IMPLANT
BUR MATCHSTICK NEURO 3.0 LAGG (BURR) ×3 IMPLANT
BUR RND DIAMOND ELITE 4.0 (BURR) IMPLANT
BUR SABER DIAMOND 5.0 (BURR) ×1 IMPLANT
CAGE CORP CAPRI 12X14 26-36 0D (Cage) ×1 IMPLANT
CANISTER SUCT 3000ML PPV (MISCELLANEOUS) ×3 IMPLANT
DRAPE C-ARM 42X72 X-RAY (DRAPES) ×6 IMPLANT
DRAPE HALF SHEET 40X57 (DRAPES) IMPLANT
DRAPE LAPAROTOMY 100X72 PEDS (DRAPES) ×3 IMPLANT
DRAPE MICROSCOPE LEICA (MISCELLANEOUS) ×3 IMPLANT
DRILL NEURO 2X3.1 SOFT TOUCH (MISCELLANEOUS) ×2
DRILL OZARK SU 2.3X16 (DRILL) ×2
DRSG MEPILEX BORDER 4X4 (GAUZE/BANDAGES/DRESSINGS) ×3 IMPLANT
DRSG OPSITE 4X5.5 SM (GAUZE/BANDAGES/DRESSINGS) ×6 IMPLANT
DRSG OPSITE POSTOP 4X6 (GAUZE/BANDAGES/DRESSINGS) ×1 IMPLANT
DURAPREP 26ML APPLICATOR (WOUND CARE) ×3 IMPLANT
ELECT COATED BLADE 2.86 ST (ELECTRODE) ×3 IMPLANT
ELECT REM PT RETURN 9FT ADLT (ELECTROSURGICAL) ×2
ELECTRODE REM PT RTRN 9FT ADLT (ELECTROSURGICAL) ×2 IMPLANT
EVACUATOR 1/8 PVC DRAIN (DRAIN) ×1 IMPLANT
GAUZE 4X4 16PLY ~~LOC~~+RFID DBL (SPONGE) IMPLANT
GAUZE SPONGE 4X4 12PLY STRL (GAUZE/BANDAGES/DRESSINGS) ×1 IMPLANT
GLOVE BIO SURGEON STRL SZ7 (GLOVE) ×1 IMPLANT
GLOVE BIOGEL PI IND STRL 7.5 (GLOVE) ×2 IMPLANT
GLOVE BIOGEL PI INDICATOR 7.5 (GLOVE) ×4
GLOVE ECLIPSE 7.5 STRL STRAW (GLOVE) ×3 IMPLANT
GLOVE SURG ENC MOIS LTX SZ8 (GLOVE) ×3 IMPLANT
GLOVE SURG UNDER POLY LF SZ8.5 (GLOVE) ×3 IMPLANT
GOWN STRL REUS W/ TWL LRG LVL3 (GOWN DISPOSABLE) ×4 IMPLANT
GOWN STRL REUS W/ TWL XL LVL3 (GOWN DISPOSABLE) ×2 IMPLANT
GOWN STRL REUS W/TWL 2XL LVL3 (GOWN DISPOSABLE) IMPLANT
GOWN STRL REUS W/TWL LRG LVL3 (GOWN DISPOSABLE) ×6
GOWN STRL REUS W/TWL XL LVL3 (GOWN DISPOSABLE) ×2
HEMOSTAT POWDER KIT SURGIFOAM (HEMOSTASIS) ×3 IMPLANT
KIT BASIN OR (CUSTOM PROCEDURE TRAY) ×3 IMPLANT
KIT TURNOVER KIT B (KITS) ×3 IMPLANT
NDL SPNL 22GX3.5 QUINCKE BK (NEEDLE) ×2 IMPLANT
NEEDLE HYPO 22GX1.5 SAFETY (NEEDLE) ×3 IMPLANT
NEEDLE SPNL 22GX3.5 QUINCKE BK (NEEDLE) ×2 IMPLANT
NS IRRIG 1000ML POUR BTL (IV SOLUTION) ×3 IMPLANT
PACK LAMINECTOMY NEURO (CUSTOM PROCEDURE TRAY) ×3 IMPLANT
PAD ARMBOARD 7.5X6 YLW CONV (MISCELLANEOUS) ×9 IMPLANT
PIN DISTRACTION 14MM (PIN) IMPLANT
PLATE CERV CONS OZARK 2X44 (Plate) ×1 IMPLANT
SCREW FIXED ST OZARK 4X16 (Screw) ×4 IMPLANT
SPIKE FLUID TRANSFER (MISCELLANEOUS) ×3 IMPLANT
SPONGE INTESTINAL PEANUT (DISPOSABLE) ×3 IMPLANT
SPONGE SURGIFOAM ABS GEL SZ50 (HEMOSTASIS) ×3 IMPLANT
STAPLER VISISTAT 35W (STAPLE) ×1 IMPLANT
STRIP CLOSURE SKIN 1/2X4 (GAUZE/BANDAGES/DRESSINGS) ×3 IMPLANT
SUT MNCRL AB 4-0 PS2 18 (SUTURE) ×3 IMPLANT
SUT SILK 2 0 TIES 10X30 (SUTURE) IMPLANT
SUT VIC AB 0 CT1 27 (SUTURE)
SUT VIC AB 0 CT1 27XBRD ANTBC (SUTURE) IMPLANT
SUT VIC AB 2-0 CP2 18 (SUTURE) ×1 IMPLANT
SUT VIC AB 3-0 SH 8-18 (SUTURE) ×3 IMPLANT
TAPE CLOTH 3X10 TAN LF (GAUZE/BANDAGES/DRESSINGS) ×3 IMPLANT
TOWEL GREEN STERILE (TOWEL DISPOSABLE) ×3 IMPLANT
TOWEL GREEN STERILE FF (TOWEL DISPOSABLE) ×3 IMPLANT
WATER STERILE IRR 1000ML POUR (IV SOLUTION) ×3 IMPLANT

## 2021-12-29 NOTE — Progress Notes (Signed)
Orthopedic Tech Progress Note Patient Details:  BRANDIN STETZER 1995/09/05 [id number]  Ortho Devices Type of Ortho Device: Sugartong splint Ortho Device/Splint Location: LUE Ortho Device/Splint Interventions: Ordered, Application, Adjustment   Post Interventions Patient Tolerated: Well Left arm splinted following reduction.  Darleen Crocker 12/29/2021, 4:27 PM

## 2021-12-29 NOTE — Progress Notes (Signed)
Orthopedic Tech Progress Note Patient Details:  Mathew Herrera [birth year] [id number]  Ortho Devices Type of Ortho Device: Sugartong splint Ortho Device/Splint Location: RUE Ortho Device/Splint Interventions: Ordered, Application, Adjustment   Post Interventions Patient Tolerated: Well Right arm splinted, PA molded splint. Darleen Crocker 12/29/2021, 6:20 PM

## 2021-12-29 NOTE — Transfer of Care (Signed)
Immediate Anesthesia Transfer of Care Note  Patient: Mathew Herrera  Procedure(s) Performed: ANTERIOR CERVICAL Seven CORPECTOMY with Plating  Patient Location: PACU  Anesthesia Type:General  Level of Consciousness: drowsy  Airway & Oxygen Therapy: Patient Spontanous Breathing and Patient connected to nasal cannula oxygen  Post-op Assessment: Report given to RN, Post -op Vital signs reviewed and stable, Patient moving all extremities and Patient moving all extremities X 4  Post vital signs: Reviewed and stable  Last Vitals:  Vitals Value Taken Time  BP 154/76 12/29/21 2315  Temp    Pulse 91 12/29/21 2318  Resp 13 12/29/21 2318  SpO2 96 % 12/29/21 2318  Vitals shown include unvalidated device data.  Last Pain:  Vitals:   12/29/21 1947  PainSc: 10-Worst pain ever         Complications: No notable events documented.
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
## 2021-12-29 NOTE — ED Notes (Signed)
Girlfriend Tobi Bastos [phone] would like an update asap
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
## 2021-12-29 NOTE — Progress Notes (Addendum)
Neurosurgery  26 yo M with complex C7 body fracture with moderate retropulsion.  There is focal kyphosis, extension into C6 lamina.  He has a mild/moderate T3 compression fracture as well. UE exam limited by splints, but he has full strength in his legs.  He is in considerable amount of pain which limits sensory examination.  He is unable to have an MRI due to unknown metallic object in his heart.  Given his unstable cervical spine fracture, I recommended reconstruction and fixation with C7 corpectomy. I discussed with him the general technique of surgery.  I discussed risks, benefits, alternatives, and expected convalescence.  Risks discussed included, but were not limited to, bleeding, pain, infection, scar, dysphagia, dysphonia, damage to nearby organs, coma, death.  Possibility of posterior instrumented fusion also discussed.  Informed consent obtained.

## 2021-12-29 NOTE — Anesthesia Procedure Notes (Signed)
Procedure Name: Intubation Date/Time: 12/29/2021 7:59 PM  Performed by: Arun Herrod T, CRNAPre-anesthesia Checklist: Patient identified, Emergency Drugs available, Suction available and Patient being monitored Patient Re-evaluated:Patient Re-evaluated prior to induction Oxygen Delivery Method: Circle system utilized Preoxygenation: Pre-oxygenation with 100% oxygen Induction Type: IV induction and Rapid sequence Ventilation: Mask ventilation without difficulty Laryngoscope Size: Glidescope and 4 Grade View: Grade I Tube type: Oral Tube size: 7.5 mm Number of attempts: 1 Airway Equipment and Method: Stylet, Oral airway and Video-laryngoscopy Placement Confirmation: ETT inserted through vocal cords under direct vision, positive ETCO2 and breath sounds checked- equal and bilateral Secured at: 23 cm Tube secured with: Tape Dental Injury: Teeth and Oropharynx as per pre-operative assessment  Difficulty Due To: Difficult Airway- due to cervical collar and Difficult Airway-  due to neck instability
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
## 2021-12-29 NOTE — ED Provider Notes (Signed)
Reduction of fracture  Date/Time: 12/29/2021 4:00 PM  Performed by: Glynn Octave, MD Authorized by: Glynn Octave, MD  Consent: The procedure was performed in an emergent situation. Verbal consent obtained. Written consent not obtained. Risks and benefits: risks, benefits and alternatives were discussed Consent given by: patient Patient identity confirmed: verbally with patient Time out: Immediately prior to procedure a "time out" was called to verify the correct patient, procedure, equipment, support staff and site/side marked as required. Local anesthesia used: no  Anesthesia: Local anesthesia used: no  Sedation: Patient sedated: yes Sedation type: anxiolysis Sedatives: midazolam Analgesia: hydromorphone Vitals: Vital signs were monitored during sedation.  Patient tolerance: patient tolerated the procedure well with no immediate complications Comments: Emergent reduction with [place] PA-C of bilateral displaced distal radius fractures with tenting of skin and concern for neurovascular compromise.        Glynn Octave, MD 12/29/21 [phone]

## 2021-12-29 NOTE — ED Notes (Signed)
Pt transported to [facility] by TRN
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
## 2021-12-30 ENCOUNTER — Other Ambulatory Visit: Payer: Self-pay

## 2021-12-30 ENCOUNTER — Encounter (HOSPITAL_COMMUNITY): Payer: Self-pay

## 2021-12-30 ENCOUNTER — Inpatient Hospital Stay (HOSPITAL_COMMUNITY): Payer: Self-pay | Admitting: Anesthesiology

## 2021-12-30 ENCOUNTER — Inpatient Hospital Stay (HOSPITAL_COMMUNITY): Payer: Self-pay

## 2021-12-30 ENCOUNTER — Encounter (HOSPITAL_COMMUNITY): Admission: EM | Disposition: A | Discharge status: Home / Self Care | Payer: Self-pay | Source: Home / Self Care

## 2021-12-30 DIAGNOSIS — S62101A Fracture of unspecified carpal bone, right wrist, initial encounter for closed fracture: Secondary | ICD-10-CM

## 2021-12-30 DIAGNOSIS — F909 Attention-deficit hyperactivity disorder, unspecified type: Secondary | ICD-10-CM

## 2021-12-30 DIAGNOSIS — S62102A Fracture of unspecified carpal bone, left wrist, initial encounter for closed fracture: Secondary | ICD-10-CM

## 2021-12-30 HISTORY — PX: ORIF WRIST FRACTURE: SHX2133

## 2021-12-30 LAB — CBC
HCT: 41.9 % (ref 39.0–52.0)
Hemoglobin: 14.4 g/dL (ref 13.0–17.0)
MCH: 32.1 pg (ref 26.0–34.0)
MCHC: 34.4 g/dL (ref 30.0–36.0)
MCV: 93.5 fL (ref 80.0–100.0)
Platelets: 211 10*3/uL (ref 150–400)
RBC: 4.48 MIL/uL (ref 4.22–5.81)
RDW: 11.5 % (ref 11.5–15.5)
WBC: 14.4 10*3/uL — ABNORMAL HIGH (ref 4.0–10.5)
nRBC: 0 % (ref 0.0–0.2)

## 2021-12-30 LAB — HIV ANTIBODY (ROUTINE TESTING W REFLEX): HIV Screen 4th Generation wRfx: NONREACTIVE

## 2021-12-30 LAB — BASIC METABOLIC PANEL
Anion gap: 7 (ref 5–15)
BUN: 12 mg/dL (ref 6–20)
CO2: 23 mmol/L (ref 22–32)
Calcium: 8.4 mg/dL — ABNORMAL LOW (ref 8.9–10.3)
Chloride: 106 mmol/L (ref 98–111)
Creatinine, Ser: 0.89 mg/dL (ref 0.61–1.24)
GFR, Estimated: >60 mL/min (ref >60)
Glucose, Bld: 138 mg/dL — ABNORMAL HIGH (ref 70–99)
Potassium: 4.2 mmol/L (ref 3.5–5.1)
Sodium: 136 mmol/L (ref 135–145)

## 2021-12-30 LAB — SAMPLE TO BLOOD BANK

## 2021-12-30 LAB — SURGICAL PCR SCREEN
MRSA, PCR: NEGATIVE
Staphylococcus aureus: NEGATIVE

## 2021-12-30 SURGERY — OPEN REDUCTION INTERNAL FIXATION (ORIF) WRIST FRACTURE
Anesthesia: General | Site: Wrist | Laterality: Bilateral

## 2021-12-30 MED ORDER — MIDAZOLAM HCL 2 MG/2ML IJ SOLN
INTRAMUSCULAR | Status: AC
  Filled 2021-12-30: qty 2

## 2021-12-30 MED ORDER — HALOPERIDOL LACTATE 5 MG/ML IJ SOLN
10.0000 mg | Freq: Four times a day (QID) | INTRAMUSCULAR | Status: DC | PRN
  Administered 2021-12-31 – 2022-01-01 (×3): 10 mg via INTRAVENOUS
  Filled 2021-12-30 (×4): qty 2

## 2021-12-30 MED ORDER — HALOPERIDOL LACTATE 5 MG/ML IJ SOLN
INTRAMUSCULAR | Status: AC
  Administered 2021-12-30: 10 mg via INTRAVENOUS
  Filled 2021-12-30: qty 2

## 2021-12-30 MED ORDER — BACITRACIN ZINC 500 UNIT/GM EX OINT
TOPICAL_OINTMENT | CUTANEOUS | Status: DC | PRN
  Administered 2021-12-30 (×2): 1 via TOPICAL

## 2021-12-30 MED ORDER — CEFAZOLIN SODIUM-DEXTROSE 2-4 GM/100ML-% IV SOLN
2.0000 g | INTRAVENOUS | Status: AC
Start: 2021-12-30 — End: 2021-12-30
  Administered 2021-12-30: 2 g via INTRAVENOUS
  Filled 2021-12-30: qty 100

## 2021-12-30 MED ORDER — CHLORHEXIDINE GLUCONATE 4 % EX LIQD
60.0000 mL | Freq: Once | CUTANEOUS | Status: AC
  Administered 2021-12-30: 4 via TOPICAL
  Filled 2021-12-30: qty 60

## 2021-12-30 MED ORDER — PHENYLEPHRINE 80 MCG/ML (10ML) SYRINGE FOR IV PUSH (FOR BLOOD PRESSURE SUPPORT)
PREFILLED_SYRINGE | INTRAVENOUS | Status: DC | PRN
  Administered 2021-12-30: 160 ug via INTRAVENOUS

## 2021-12-30 MED ORDER — FENTANYL CITRATE (PF) 250 MCG/5ML IJ SOLN
INTRAMUSCULAR | Status: DC | PRN
Start: 2021-12-30 — End: 2021-12-30
  Administered 2021-12-30 (×2): 50 ug via INTRAVENOUS
  Administered 2021-12-30: 150 ug via INTRAVENOUS

## 2021-12-30 MED ORDER — KETOROLAC TROMETHAMINE 15 MG/ML IJ SOLN
30.0000 mg | Freq: Four times a day (QID) | INTRAMUSCULAR | Status: DC
  Administered 2021-12-30 – 2022-01-01 (×8): 30 mg via INTRAVENOUS
  Filled 2021-12-30 (×8): qty 2

## 2021-12-30 MED ORDER — POVIDONE-IODINE 10 % EX SWAB: 2.0000 | Freq: Once | CUTANEOUS | Status: DC

## 2021-12-30 MED ORDER — PHENYLEPHRINE HCL-NACL 20-0.9 MG/250ML-% IV SOLN
INTRAVENOUS | Status: DC | PRN
  Administered 2021-12-30: 20 ug/min via INTRAVENOUS

## 2021-12-30 MED ORDER — PROMETHAZINE HCL 25 MG/ML IJ SOLN: 6.2500 mg | INTRAMUSCULAR | Status: DC | PRN

## 2021-12-30 MED ORDER — PROPOFOL 10 MG/ML IV BOLUS
INTRAVENOUS | Status: DC | PRN
  Administered 2021-12-30: 100 mg via INTRAVENOUS

## 2021-12-30 MED ORDER — ONDANSETRON HCL 4 MG/2ML IJ SOLN
INTRAMUSCULAR | Status: DC | PRN
  Administered 2021-12-30: 4 mg via INTRAVENOUS

## 2021-12-30 MED ORDER — METHOCARBAMOL 1000 MG/10ML IJ SOLN
1000.0000 mg | Freq: Three times a day (TID) | INTRAVENOUS | Status: DC
  Administered 2021-12-30 – 2022-01-01 (×7): 1000 mg via INTRAVENOUS
  Filled 2021-12-30 (×2): qty 10
  Filled 2021-12-30: qty 1000
  Filled 2021-12-30: qty 10
  Filled 2021-12-30: qty 1000
  Filled 2021-12-30 (×2): qty 10
  Filled 2021-12-30: qty 1000
  Filled 2021-12-30 (×2): qty 10
  Filled 2021-12-30: qty 1000

## 2021-12-30 MED ORDER — 0.9 % SODIUM CHLORIDE (POUR BTL) OPTIME
TOPICAL | Status: DC | PRN
  Administered 2021-12-30: 1000 mL

## 2021-12-30 MED ORDER — SUCCINYLCHOLINE CHLORIDE 200 MG/10ML IV SOSY
PREFILLED_SYRINGE | INTRAVENOUS | Status: DC | PRN
  Administered 2021-12-30: 60 mg via INTRAVENOUS

## 2021-12-30 MED ORDER — MIDAZOLAM HCL 2 MG/2ML IJ SOLN
INTRAMUSCULAR | Status: DC | PRN
  Administered 2021-12-30: 2 mg via INTRAVENOUS

## 2021-12-30 MED ORDER — ACETAMINOPHEN 10 MG/ML IV SOLN
1000.0000 mg | Freq: Once | INTRAVENOUS | Status: DC | PRN
Start: 2021-12-30 — End: 2021-12-30

## 2021-12-30 MED ORDER — EPHEDRINE SULFATE-NACL 50-0.9 MG/10ML-% IV SOSY
PREFILLED_SYRINGE | INTRAVENOUS | Status: DC | PRN
  Administered 2021-12-30: 5 mg via INTRAVENOUS

## 2021-12-30 MED ORDER — BACITRACIN ZINC 500 UNIT/GM EX OINT
TOPICAL_OINTMENT | CUTANEOUS | Status: AC
  Filled 2021-12-30: qty 28.35

## 2021-12-30 MED ORDER — SUGAMMADEX SODIUM 200 MG/2ML IV SOLN
INTRAVENOUS | Status: DC | PRN
  Administered 2021-12-30: 200 mg via INTRAVENOUS

## 2021-12-30 MED ORDER — AMISULPRIDE (ANTIEMETIC) 5 MG/2ML IV SOLN: 10.0000 mg | Freq: Once | INTRAVENOUS | Status: DC | PRN

## 2021-12-30 MED ORDER — BUPIVACAINE HCL (PF) 0.25 % IJ SOLN
INTRAMUSCULAR | Status: DC | PRN
  Administered 2021-12-30 (×2): 5 mL

## 2021-12-30 MED ORDER — LIDOCAINE HCL (PF) 1 % IJ SOLN
INTRAMUSCULAR | Status: DC | PRN
  Administered 2021-12-30 (×2): 5 mL

## 2021-12-30 MED ORDER — OXYCODONE HCL 5 MG PO TABS: 5.0000 mg | ORAL_TABLET | Freq: Once | ORAL | Status: DC | PRN

## 2021-12-30 MED ORDER — HALOPERIDOL LACTATE 5 MG/ML IJ SOLN: 10.0000 mg | Freq: Four times a day (QID) | INTRAMUSCULAR | Status: DC | PRN

## 2021-12-30 MED ORDER — LACTATED RINGERS IV SOLN: INTRAVENOUS | Status: DC

## 2021-12-30 MED ORDER — DEXAMETHASONE SODIUM PHOSPHATE 10 MG/ML IJ SOLN
INTRAMUSCULAR | Status: DC | PRN
  Administered 2021-12-30: 10 mg via INTRAVENOUS

## 2021-12-30 MED ORDER — BUPIVACAINE HCL (PF) 0.25 % IJ SOLN
INTRAMUSCULAR | Status: AC
  Filled 2021-12-30: qty 30

## 2021-12-30 MED ORDER — LIDOCAINE 2% (20 MG/ML) 5 ML SYRINGE
INTRAMUSCULAR | Status: DC | PRN
  Administered 2021-12-30: 60 mg via INTRAVENOUS

## 2021-12-30 MED ORDER — FENTANYL CITRATE (PF) 100 MCG/2ML IJ SOLN: 25.0000 ug | INTRAMUSCULAR | Status: DC | PRN

## 2021-12-30 MED ORDER — CHLORHEXIDINE GLUCONATE 0.12 % MT SOLN: 15.0000 mL | Freq: Once | OROMUCOSAL | Status: DC

## 2021-12-30 MED ORDER — ORAL CARE MOUTH RINSE: 15.0000 mL | Freq: Once | OROMUCOSAL | Status: DC

## 2021-12-30 MED ORDER — FENTANYL CITRATE (PF) 250 MCG/5ML IJ SOLN
INTRAMUSCULAR | Status: AC
  Filled 2021-12-30: qty 5

## 2021-12-30 MED ORDER — KETOROLAC TROMETHAMINE 30 MG/ML IJ SOLN: 30.0000 mg | Freq: Once | INTRAMUSCULAR | Status: DC

## 2021-12-30 MED ORDER — LIDOCAINE HCL (PF) 1 % IJ SOLN
INTRAMUSCULAR | Status: AC
  Filled 2021-12-30: qty 30

## 2021-12-30 MED ORDER — OXYCODONE HCL 5 MG/5ML PO SOLN: 5.0000 mg | Freq: Once | ORAL | Status: DC | PRN

## 2021-12-30 MED ORDER — ROCURONIUM BROMIDE 10 MG/ML (PF) SYRINGE
PREFILLED_SYRINGE | INTRAVENOUS | Status: DC | PRN
  Administered 2021-12-30: 40 mg via INTRAVENOUS

## 2021-12-30 SURGICAL SUPPLY — 57 items
BIT DRILL 2.2 SS TIBIAL (BIT) ×1 IMPLANT
BLADE SURG 15 STRL LF DISP TIS (BLADE) IMPLANT
BLADE SURG 15 STRL SS (BLADE) ×4
BNDG CMPR 9X4 STRL LF SNTH (GAUZE/BANDAGES/DRESSINGS) ×1
BNDG ELASTIC 3X5.8 VLCR STR LF (GAUZE/BANDAGES/DRESSINGS) ×2 IMPLANT
BNDG ELASTIC 4X5.8 VLCR STR LF (GAUZE/BANDAGES/DRESSINGS) ×2 IMPLANT
BNDG ESMARK 4X9 LF (GAUZE/BANDAGES/DRESSINGS) ×3 IMPLANT
CANISTER SUCT 3000ML PPV (MISCELLANEOUS) ×3 IMPLANT
CORD BIPOLAR FORCEPS 12FT (ELECTRODE) ×3 IMPLANT
COVER SURGICAL LIGHT HANDLE (MISCELLANEOUS) ×3 IMPLANT
CUFF TOURN SGL QUICK 18X4 (TOURNIQUET CUFF) ×3 IMPLANT
DRAPE OEC MINIVIEW 54X84 (DRAPES) ×3 IMPLANT
DRILL CANN MAX VPC 3.2MM (DRILL) IMPLANT
DRSG ADAPTIC 3X8 NADH LF (GAUZE/BANDAGES/DRESSINGS) ×2 IMPLANT
GAUZE SPONGE 4X4 12PLY STRL (GAUZE/BANDAGES/DRESSINGS) ×2 IMPLANT
GLOVE BIOGEL PI IND STRL 7.5 (GLOVE) IMPLANT
GLOVE BIOGEL PI INDICATOR 7.5 (GLOVE) ×8
GOWN STRL REUS W/ TWL LRG LVL3 (GOWN DISPOSABLE) ×4 IMPLANT
GOWN STRL REUS W/TWL LRG LVL3 (GOWN DISPOSABLE) ×4
HIBICLENS CHG 4% 4OZ BTL (MISCELLANEOUS) ×4 IMPLANT
K-WIRE 1.6 (WIRE) ×6
K-WIRE COCR 1.4 X 127 (WIRE) ×6
K-WIRE FX5X1.6XNS BN SS (WIRE) ×3
KIT BASIN OR (CUSTOM PROCEDURE TRAY) ×3 IMPLANT
KIT TURNOVER KIT B (KITS) ×3 IMPLANT
KWIRE COCR 1.4 X 127 (WIRE) IMPLANT
KWIRE FX5X1.6XNS BN SS (WIRE) IMPLANT
MAX VPC CANN DRILL 3.2MM (DRILL) ×2
NDL HYPO 25GX1X1/2 BEV (NEEDLE) ×2 IMPLANT
NEEDLE HYPO 25GX1X1/2 BEV (NEEDLE) ×4 IMPLANT
NS IRRIG 1000ML POUR BTL (IV SOLUTION) ×3 IMPLANT
PACK ORTHO EXTREMITY (CUSTOM PROCEDURE TRAY) ×3 IMPLANT
PAD ARMBOARD 7.5X6 YLW CONV (MISCELLANEOUS) ×6 IMPLANT
PAD CAST 3X4 CTTN HI CHSV (CAST SUPPLIES) IMPLANT
PAD CAST 4YDX4 CTTN HI CHSV (CAST SUPPLIES) IMPLANT
PADDING CAST COTTON 3X4 STRL (CAST SUPPLIES) ×4
PADDING CAST COTTON 4X4 STRL (CAST SUPPLIES) ×4
PLATE DVR CROSSLOCK 180 WRIST (Plate) ×1 IMPLANT
SCREW  LP NL 2.7X16MM (Screw) ×4 IMPLANT
SCREW 2.7X14MM (Screw) ×4 IMPLANT
SCREW BN 14X2.7XNONLOCK 3 LD (Screw) IMPLANT
SCREW CANN VCP 4.0X26 (Screw) ×1 IMPLANT
SCREW LOCKING 2.7X15MM (Screw) ×1 IMPLANT
SCREW LP NL 2.7X16MM (Screw) IMPLANT
SCREW NLOCK 2.7X14 (Screw) ×2 IMPLANT
SCREW NONLOCK 2.7X18MM (Screw) ×1 IMPLANT
SCREW VPC 4.0X38 (Screw) ×2 IMPLANT
SPIKE FLUID TRANSFER (MISCELLANEOUS) ×2 IMPLANT
SPLINT FIBERGLASS 3X12 (CAST SUPPLIES) ×2 IMPLANT
SUCTION FRAZIER HANDLE 10FR (MISCELLANEOUS) ×2
SUCTION TUBE FRAZIER 10FR DISP (MISCELLANEOUS) IMPLANT
SUT ETHILON 4 0 PS 2 18 (SUTURE) ×5 IMPLANT
SYR CONTROL 10ML LL (SYRINGE) ×1 IMPLANT
TOWEL GREEN STERILE (TOWEL DISPOSABLE) ×3 IMPLANT
TUBE CONNECTING 12X1/4 (SUCTIONS) ×3 IMPLANT
UNDERPAD 30X36 HEAVY ABSORB (UNDERPADS AND DIAPERS) ×4 IMPLANT
WATER STERILE IRR 1000ML POUR (IV SOLUTION) ×3 IMPLANT

## 2021-12-30 NOTE — Progress Notes (Signed)
Pt transported off unit to OR for surgery. Pt telemetry removed prior to transport. Report given off to short stay RN. Pt transported off unit via bed. Dionne Bucy RN

## 2021-12-30 NOTE — Anesthesia Procedure Notes (Addendum)
Procedure Name: Intubation Date/Time: 12/30/2021 3:01 PM  Performed by: Carlos American, CRNAPre-anesthesia Checklist: Patient identified, Emergency Drugs available, Suction available and Patient being monitored Patient Re-evaluated:Patient Re-evaluated prior to induction Oxygen Delivery Method: Circle System Utilized Preoxygenation: Pre-oxygenation with 100% oxygen Induction Type: IV induction Ventilation: Mask ventilation without difficulty Laryngoscope Size: Glidescope and 3 Grade View: Grade I Tube type: Oral Tube size: 7.5 mm Number of attempts: 1 Airway Equipment and Method: Rigid stylet and Video-laryngoscopy Placement Confirmation: ETT inserted through vocal cords under direct vision, positive ETCO2 and breath sounds checked- equal and bilateral Secured at: 22 cm Tube secured with: Tape Dental Injury: Teeth and Oropharynx as per pre-operative assessment  Comments: C-collar in place, head neutral

## 2021-12-30 NOTE — Transfer of Care (Signed)
Immediate Anesthesia Transfer of Care Note  Patient: Mathew Herrera [facility]  Procedure(s) Performed: OPEN REDUCTION INTERNAL FIXATION (ORIF) WRIST FRACTURE (Bilateral: Wrist)  Patient Location: PACU  Anesthesia Type:General  Level of Consciousness: drowsy and patient cooperative  Airway & Oxygen Therapy: Patient Spontanous Breathing  Post-op Assessment: Report given to RN and Post -op Vital signs reviewed and stable  Post vital signs: Reviewed and stable  Last Vitals:  Vitals Value Taken Time  BP 186/71 12/30/21 1800  Temp    Pulse 88 12/30/21 1804  Resp 17 12/30/21 1804  SpO2 99 % 12/30/21 1804  Vitals shown include unvalidated device data.  Last Pain:  Vitals:   12/30/21 1214  TempSrc: Oral  PainSc: 0-No pain      Patients Stated Pain Goal: 3 (12/30/21 1214)  Complications: No notable events documented.

## 2021-12-30 NOTE — Anesthesia Preprocedure Evaluation (Addendum)
Anesthesia Evaluation  Patient identified by MRN, date of birth, ID band Patient awake    Reviewed: Allergy & Precautions, NPO status , Patient's Chart, lab work & pertinent test results  Airway Mallampati: II  TM Distance: >3 FB Neck ROM: Limited    Dental no notable dental hx.    Pulmonary Current Smoker and Patient abstained from smoking.,    Pulmonary exam normal        Cardiovascular negative cardio ROS Normal cardiovascular exam     Neuro/Psych  Headaches, PSYCHIATRIC DISORDERS Depression    GI/Hepatic negative GI ROS, (+)     substance abuse  ,   Endo/Other  negative endocrine ROS  Renal/GU negative Renal ROS     Musculoskeletal   Abdominal   Peds  (+) ADHD Hematology negative hematology ROS (+)   Anesthesia Other Findings Bilateral Wrist Fractures  Reproductive/Obstetrics                             Anesthesia Physical Anesthesia Plan  ASA: 3  Anesthesia Plan: General   Post-op Pain Management:    Induction: Intravenous  PONV Risk Score and Plan: 1 and Ondansetron, Dexamethasone, Midazolam and Treatment may vary due to age or medical condition  Airway Management Planned: Oral ETT and Video Laryngoscope Planned  Additional Equipment:   Intra-op Plan:   Post-operative Plan: Extubation in OR  Informed Consent: I have reviewed the patients History and Physical, chart, labs and discussed the procedure including the risks, benefits and alternatives for the proposed anesthesia with the patient or authorized representative who has indicated his/her understanding and acceptance.     Dental advisory given  Plan Discussed with: CRNA and Surgeon  Anesthesia Plan Comments:        Anesthesia Quick Evaluation
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
## 2021-12-30 NOTE — Op Note (Signed)
OPERATIVE NOTE  DATE OF PROCEDURE: 12/30/2021  SURGEONS:  Primary: Orene Desanctis, MD  PREOPERATIVE DIAGNOSIS: Bilateral Wrist Fractures  POSTOPERATIVE DIAGNOSIS: Same  NAME OF PROCEDURE:   Right wrist ORIF, intra-articular, >3 fragments Right wrist four view radiographs with intraoperative interpretation Left wrist ORIF, extra-articular, 2 fragments Left wrist brachioradialis tenotomy Left wrist four view radiographs with intraoperative interpretation  ANESTHESIA: General + Local  SKIN PREPARATION: Hibiclens  ESTIMATED BLOOD LOSS: Minimal  IMPLANTS:  Right- dorsal spanning plate, Left- headless screws Implant Name Type Inv. Item Serial No. Manufacturer Lot No. LRB No. Used Action  PLATE DVR CROSSLOCK 177 WRIST - [id number] Plate PLATE DVR CROSSLOCK 330 WRIST  [employer]ORTH,TRAU[employer]) [id number] Right 1 Implanted  SCREW 2.7X14MM - [id number] Screw SCREW 2.7X14MM  [employer](ORTH,TRAU,[employer],SG)  Right 2 Implanted  SCREW  LP NL 2.7X16MM - [id number] Screw SCREW  LP NL 2.7X16MM  [employer](ORTH,TRAU,[employer],SG)  Right 2 Implanted  SCREW NONLOCK 2.7X18MM - [id number] Screw SCREW NONLOCK 2.7X18MM  [employer](ORTH,TRAU,[employer],SG)  Right 1 Implanted  SCREW LOCKING 2.7X15MM - [id number] Screw SCREW LOCKING 2.7X15MM  [employer](ORTH,TRAU,BIO,SG)  Right 1 Implanted  SCREW VPC 4.0X38 - [id number] Screw SCREW VPC 4.0X38  [employer](ORTH,TRAU,BIO,SG)  Left 2 Implanted     INDICATIONS:  Ashvik is a 26 y.o. male who has the above preoperative diagnosis. The patient has decided to proceed with surgical intervention.  Risks, benefits and alternatives of operative management were discussed including, but not limited to, risks of anesthesia complications, infection, pain, persistent symptoms, stiffness, need for future surgery.  The patient understands, agrees and elects to proceed with surgery.  We discussed that due to the polytrauma situation status post corpectomy and fusion of the cervical spine and  bilateral wrist injuries closed treatment of the wrist may impede his ability to rehabilitate and mobilize.  Plan is for open reduction internal fixation to both sides however I did discuss we will try to perform a closed reduction on the left and if it is stable we can potentially treat this closed however if any signs of difficulty maintaining reduction with closed reduction we will plan for ORIF of the left in addition to ORIF on the right side.  Due to severe comminution on the right side of the distal radius we will plan for a dorsal spanning plate which will also allow for early weightbearing if absolutely needed.  DESCRIPTION OF PROCEDURE: The patient was met in the pre-operative area and their identity was verified.  The operative location and laterality was also verified and marked.  The patient was brought to the OR and was placed supine on the table.  After repeat patient identification with the operative team anesthesia was provided and the patient was prepped and draped in the usual sterile fashion.  A final timeout was performed verifying the correction patient, procedure, location and laterality.  Surgery on the right side was begun by sterile prep and drape of the right side only elevation of the right upper extremity and exsanguinated with an Esmarch and tourniquet inflation to 250 mmHg.  A longitudinal incision was made over the index metacarpal skin and subcutaneous tissues were divided and the bone was identified the periosteum was incised and the interval between the extensor carpi radialis brevis and longus was identified.  The plate was then slid from distal to proximal along the distal radius and an incision was made proximally about the radius.  The skin and subcutaneous tissues were divided bipolar was used for hemostasis and the plate was beneath  the tendons of the extensor compartments.  There is full passive range of motion of the digits.  The open reduction internal fixation of the  right wrist was performed.  There were multiple comminuted fragments and the plan was to apply traction to the wrist to allow for ligamentotaxis to restore the length and alignment of the right distal radius fracture.  This was applied and a nonlocking screw was placed both in the index metacarpal and proximally in the radius.  4 view radiographs of the right wrist were obtained and confirmed adequate length alignment and rotation of the distal radius fracture.  The remainder of the screws were filled with proximally distally with excellent fixation.  The wound was thoroughly irrigated and closed with 4-0 nylon suture in horizontal mattress fashion.  A sterile soft bandage was applied followed by a fiberglass splint.  The tourniquet was deflated and the fingers were pink and warm and well-perfused at the end of the case.  At this time attention was turned to open reduction internal fixation of the left wrist.  A new sterile prep and drape of the left wrist was then performed followed by elevation of the left upper extremity exsanguination with an Esmarch and tourniquet inflated to 250 mmHg.  Of note the antecubital fossa was being utilized for IV access which was critical to be maintained thus a forearm tourniquet was utilized.  A closed reduction was performed however the coronal translation as well as dorsal angulation was unable to be completely corrected and maintained.  A volar approach through the floor of the FCR was utilized and there was interposed pronator quadratus which was removed and the brachioradialis tendon insertion was released and an open reduction of the distal radius fracture was obtained.  There was approximately 2 to 3 mm of distal fragment which was all in 1 piece with the radial styloid. The distal fragment was too distal for a volar plate.  I decided to proceed with fixation via the radial styloid was able to correct the coronal translation and dorsal angulation and an incision was made  over the radial styloid radial sensory nerve was protected and a .062 K wire was placed in retrograde fashion from the radial styloid to the ulnar cortex of the radius.  At this time 2 additional 1.4 K wires were placed and headless compression screws were utilized with excellent stability at the fracture site.  I did bring the wrist through range of motion and considered dorsal bridge plating however there is excellent stability and due to the extra dissection and exposure required decided to proceed with the headless screw fixation and this will be followed by splinting.  The wounds were thoroughly irrigated and the incisions were closed with 4-0 nylon sutures.  Sterile soft bandage was applied followed by a plaster splint.  The tourniquet was deflated and the fingers were pink and warm and well-perfused with brisk capillary refill to all digits.  At the end of the case all counts were correct x2.  4 view radiographs of the right wrist and left wrist were obtained which confirmed adequate length alignment and rotation of the distal radius fractures with restoration of near anatomic alignment.  The patient was awoken from anesthesia.  He tolerated the procedure well.  He was brought to recovery in stable condition.   Matt Holmes, MD

## 2021-12-30 NOTE — Progress Notes (Signed)
Pt back to the unit from [facility]. Pt sleeping but respond to voice and pain. Pt BUE compression dsgs remain clean, dry and intact. No active bleeding or drainage noted. Foley remains intact, IV intact and transfusing to RUE. Neck collar on and adjusted on arrival for alignment. Pt's hemavac intact to charge suction to right anterior neck, 20 bloody drainage emptied. Anterior honeycomb dsg to neck incision remains unremarkable CDI. VSS, Pt's girlfriend at bedside. Reported off to oncoming RN. Arabella Merles Tanija Germani RN   12/30/21 1850  Vitals  Temp 99.8 F (37.7 C)  Temp Source Oral  BP (!) 161/75  MAP (mmHg) 97  BP Location Right Leg  BP Method Automatic  Patient Position (if appropriate) Lying  Pulse Rate 88  Pulse Rate Source Monitor  Resp 16  Level of Consciousness  Level of Consciousness Responds to Voice  MEWS COLOR  MEWS Score Color Green  Oxygen Therapy  SpO2 94 %  O2 Device Room Air  Pain Assessment  Pain Scale 0-10  Pain Score Asleep  MEWS Score  MEWS Temp 0  MEWS Systolic 0  MEWS Pulse 0  MEWS RR 0  MEWS LOC 1  MEWS Score 1
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
## 2021-12-30 NOTE — Progress Notes (Signed)
  Transition of Care [facility]) Screening Note   Patient Details  Name: Mathew Herrera Date of Birth: June 07, 1996   Transition of Care [facility]) CM/SW Contact:    Windle Guard, LCSW Phone Number: 12/30/2021, 8:31 AM    Transition of Care Department [facility]) has reviewed patient and noted no immediate TOC needs pending continued medical work-up. TOC team will continue to monitor patient advancement through interdisciplinary progression rounds to support any identified discharge supports as needed. If new patient transition needs arise, please place a TOC consult or reach out to [facility] team.

## 2021-12-30 NOTE — Progress Notes (Signed)
Orthopedic Tech Progress Note Patient Details:  Mathew Herrera 07-Dec-1995 [id number]  Patient ID: [name], male   DOB: Jul 26, 1995, 26 y.o.   MRN: [id number] Brace ordered. Valery Chance L Jaselyn Nahm 12/30/2021, 12:01 AM

## 2021-12-30 NOTE — Progress Notes (Signed)
Trauma Event Note  TRN at bedside to round/perform CAGE AID. Patient lethargic but rouses to verbal stimuli. Not answering questions at this time r/t how he's feeling, pain, etc. No family at bedside at the time of my rounds. CAGE AID to be completed at another time. VSS, c-collar in place and aligned.   Last imported Vital Signs BP (!) 163/78 (BP Location: Right Leg)   Pulse 81   Temp 98.9 F (37.2 C) (Oral)   Resp 17   Ht 5\' 10"  (1.778 m)   Wt 153 lb (69.4 kg)   SpO2 96%   BMI 21.95 kg/m   Trending CBC Recent Labs    12/29/21 1714 12/29/21 1723 12/30/21 0307  WBC 23.4*  --  14.4*  HGB 14.1 14.6 14.4  HCT 40.0 43.0 41.9  PLT 189  --  211    Trending Coag's Recent Labs    12/29/21 1714  INR 1.3*    Trending BMET Recent Labs    12/29/21 1714 12/29/21 1723 12/30/21 0307  NA 138 139 136  K 4.2 5.3* 4.2  CL 106 104 106  CO2 23  --  23  BUN 17 23* 12  CREATININE 1.03 0.90 0.89  GLUCOSE 94 96 138*      Mathew Herrera  Trauma Response RN  Please call TRN at [phone] for further assistance.

## 2021-12-31 ENCOUNTER — Inpatient Hospital Stay (HOSPITAL_COMMUNITY): Payer: Self-pay

## 2021-12-31 DIAGNOSIS — M532X2 Spinal instabilities, cervical region: Secondary | ICD-10-CM

## 2021-12-31 MED ORDER — NICOTINE 21 MG/24HR TD PT24
21.0000 mg | MEDICATED_PATCH | Freq: Every day | TRANSDERMAL | Status: DC
  Administered 2021-12-31 – 2022-01-01 (×2): 21 mg via TRANSDERMAL
  Filled 2021-12-31 (×2): qty 1

## 2021-12-31 MED ORDER — HYDROMORPHONE HCL 1 MG/ML IJ SOLN: 0.5000 mg | Freq: Four times a day (QID) | INTRAMUSCULAR | Status: DC | PRN

## 2021-12-31 MED ORDER — CHLORHEXIDINE GLUCONATE CLOTH 2 % EX PADS
6.0000 | MEDICATED_PAD | Freq: Every day | CUTANEOUS | Status: DC
  Administered 2022-01-01: 6 via TOPICAL
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
## 2021-12-31 NOTE — Progress Notes (Signed)
Postop day 1 status post bilateral wrist ORIF.  Patient is doing well with minimal pain in bilateral wrists he is able to flex and extend the digits and denies paresthesias.  He will be mobilizing with PT today.  The patient is awake alert and oriented and is in a cervical collar status post neck fusion.  Physical exam of bilateral wrist shows short arm splints in place able to flex and extend the digits fully.  Swelling is down.   Sensation tact light touch in median, radial, ulnar nerve distributions.  Brisk cap refill to all digits.  Continue nonweightbearing to the left upper extremity he is okay to platform weight-bear through the right upper extremity as he does have a dorsal spanning plate for added stability to that right wrist.  Continue to elevate as much as possible.  Continue to mobilize with PT and OT.  The patient will follow-up with me in 10  to 14 days for repeat examination in my office.
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
## 2021-12-31 NOTE — Anesthesia Postprocedure Evaluation (Signed)
Anesthesia Post Note  Patient: DEVINE KLINGEL  Procedure(s) Performed: ANTERIOR CERVICAL Seven CORPECTOMY with Plating     Patient location during evaluation: PACU Anesthesia Type: General Level of consciousness: awake and alert Pain management: pain level controlled Vital Signs Assessment: post-procedure vital signs reviewed and stable Respiratory status: spontaneous breathing, nonlabored ventilation, respiratory function stable and patient connected to nasal cannula oxygen Cardiovascular status: blood pressure returned to baseline and stable Postop Assessment: no apparent nausea or vomiting Anesthetic complications: no   No notable events documented.  Last Vitals:  Vitals:   12/31/21 1025 12/31/21 1427  BP: (!) 143/67 (!) 152/74  Pulse: 64 79  Resp: 14 18  Temp: 36.6 C 37 C  SpO2: 100% 99%    Last Pain:  Vitals:   12/31/21 1427  TempSrc: Oral  PainSc:                  Nelle Don Gerilyn Stargell
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
## 2021-12-31 NOTE — Progress Notes (Addendum)
Patient's girlfriend [place] (emergency contact) would like an update from MD when possible. She states it's okay to call her at any time, day or night. [phone]

## 2021-12-31 NOTE — Anesthesia Postprocedure Evaluation (Signed)
Anesthesia Post Note  Patient: Mathew Herrera  Procedure(s) Performed: OPEN REDUCTION INTERNAL FIXATION (ORIF) WRIST FRACTURE (Bilateral: Wrist)     Patient location during evaluation: PACU Anesthesia Type: General Level of consciousness: awake Pain management: pain level controlled Vital Signs Assessment: post-procedure vital signs reviewed and stable Respiratory status: spontaneous breathing, nonlabored ventilation, respiratory function stable and patient connected to nasal cannula oxygen Cardiovascular status: blood pressure returned to baseline and stable Postop Assessment: no apparent nausea or vomiting Anesthetic complications: no   No notable events documented.  Last Vitals:  Vitals:   12/30/21 1934 12/30/21 2332  BP: (!) 163/78 (!) 166/81  Pulse: 81 67  Resp: 17 13  Temp: 37.2 C 37.2 C  SpO2: 96% 97%    Last Pain:  Vitals:   12/30/21 2332  TempSrc: Oral  PainSc:                  Catheryn Bacon Tangela Dolliver

## 2022-01-01 ENCOUNTER — Encounter (HOSPITAL_COMMUNITY): Payer: Self-pay | Admitting: Neurosurgery

## 2022-01-01 ENCOUNTER — Other Ambulatory Visit (HOSPITAL_COMMUNITY): Payer: Self-pay

## 2022-01-01 MED ORDER — METHOCARBAMOL 500 MG PO TABS
1000.0000 mg | ORAL_TABLET | Freq: Four times a day (QID) | ORAL | 0 refills | Status: DC | PRN
Start: 2022-01-01 — End: 2023-06-20
  Filled 2022-01-01: qty 60, 8d supply, fill #0

## 2022-01-01 MED ORDER — GABAPENTIN 300 MG PO CAPS
300.0000 mg | ORAL_CAPSULE | Freq: Three times a day (TID) | ORAL | 0 refills | Status: DC | PRN
  Filled 2022-01-01: qty 60, 20d supply, fill #0

## 2022-01-01 MED ORDER — BUSPIRONE HCL 10 MG PO TABS
10.0000 mg | ORAL_TABLET | Freq: Two times a day (BID) | ORAL | 0 refills | Status: DC
  Filled 2022-01-01: qty 60, 30d supply, fill #0

## 2022-01-01 MED ORDER — ENOXAPARIN SODIUM 30 MG/0.3ML IJ SOSY
30.0000 mg | PREFILLED_SYRINGE | Freq: Two times a day (BID) | INTRAMUSCULAR | Status: DC
  Administered 2022-01-01: 30 mg via SUBCUTANEOUS
  Filled 2022-01-01: qty 0.3

## 2022-01-01 MED ORDER — BUSPIRONE HCL 5 MG PO TABS
10.0000 mg | ORAL_TABLET | Freq: Two times a day (BID) | ORAL | Status: DC
  Administered 2022-01-01: 10 mg via ORAL
  Filled 2022-01-01: qty 2

## 2022-01-01 MED ORDER — ACETAMINOPHEN 500 MG PO TABS: 1000.0000 mg | ORAL_TABLET | Freq: Three times a day (TID) | ORAL | 0 refills | Status: DC | PRN

## 2022-01-01 MED ORDER — OXYCODONE HCL 5 MG PO TABS
5.0000 mg | ORAL_TABLET | Freq: Four times a day (QID) | ORAL | 0 refills | Status: DC | PRN
  Filled 2022-01-01: qty 20, 5d supply, fill #0

## 2022-01-01 MED FILL — Thrombin For Soln 5000 Unit: CUTANEOUS | Qty: 2 | Status: AC
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
## 2022-01-01 NOTE — TOC CAGE-AID Note (Signed)
Transition of Care [facility]) - CAGE-AID Screening   Patient Details  Name: Mathew Herrera MRN: [id number] Date of Birth: May 25, 1996  Transition of Care [facility]) CM/SW Contact:    Glennon Mac, RN Phone Number: 01/01/2022, 5:05 PM   Clinical Narrative: Patient s/p [facility] on 12/29/2021.  Patient admits to smoking marijuana daily, but does not wish to quit.  He declines SA resources.    CAGE-AID Screening:    Have You Ever Felt You Ought to Cut Down on Your Drinking or Drug Use?: No Have People Annoyed You By Critizing Your Drinking Or Drug Use?: No Have You Felt Bad Or Guilty About Your Drinking Or Drug Use?: No Have You Ever Had a Drink or Used Drugs First Thing In The Morning to Steady Your Nerves or to Get Rid of a Hangover?: No CAGE-AID Score: 0  Substance Abuse Education Offered: Yes     Quintella Baton, RN, BSN  Trauma/Neuro ICU Case Manager [phone]

## 2022-01-01 NOTE — Progress Notes (Signed)
Pt hemavac removed per order. Site clean, dry and intact. No drainage or active bleeding from site. Hemavac tip intact with no discharge noted. Clean, dry and new honeycomb dsg applied to the side. Will continue to closely monitor. Dionne Bucy RN

## 2022-01-01 NOTE — Progress Notes (Signed)
Pt discharge education and instructions completed with pt and family member at bedside. Both voices understanding and denies any questions. Pt IV and telemetry removed prior to discharge. Pt TLSO brace remain on and aligned. BUE splint/compression dsg remains unremarkable CDI. Pt home dme tub bench delivered to pt at bedside. Pt prescriptions including buspirone, gabapentin, robaxin and oxycodone handed to pt by TOC. Pt discharge home with sister in-law to transport him home. Pt transported off unit via wheelchair with belongings to the side. Dionne Bucy RN

## 2022-01-01 NOTE — Discharge Instructions (Addendum)
Please continue your CTSO brace for your spine fractures as recommended by Neurosurgery. Please call their office to arrange follow up.  Orthopedics is recommending non-weightbearing to the left upper extremity. You are okay for platform weightbearing through the right upper extremity.  You were found to have a metallic object in the right ventricle and along the left sacral central canal that was felt to be likely chronic. This is for you to know for medically history. There were no interventions performed on this during admission.   Mental Health Resources for Anxiety Please present to one of the following facilities to continue medication management and therapy services:   [facility]  [address]  [place], [place] [address] [phone] Please note: Walk in hours are Mon-Fri @ 0745 am.    [facility]  [address],  [place], [place] [address] [phone] Please note: In order to start services, please present for walk in hours Mon-Fri at 0745 am.
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
## 2022-01-02 ENCOUNTER — Encounter (HOSPITAL_COMMUNITY): Payer: Self-pay | Admitting: Neurosurgery

## 2022-01-03 ENCOUNTER — Encounter (HOSPITAL_COMMUNITY): Payer: Self-pay | Admitting: Orthopedic Surgery

## 2022-02-21 ENCOUNTER — Encounter (HOSPITAL_BASED_OUTPATIENT_CLINIC_OR_DEPARTMENT_OTHER): Payer: Self-pay | Admitting: Orthopedic Surgery
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
## 2022-02-23 NOTE — Progress Notes (Addendum)
COVID Vaccine received:  [x]  No []  Yes Date of any COVID positive Test in last 90 days:  PCP - none Cardiologist -   Chest x-ray - 12-30-2021 Epic EKG -  12-29-2021  Epic Stress Test - n/a ECHO - n/a Cardiac Cath - n/a  Pacemaker/ICD device     [x]  N/A Spinal Cord Stimulator:[x]  No []  Yes      (Remind patient to bring remote DOS) Other Implants:   History of Sleep Apnea? [x]  No []  Yes   Sleep Study Date:   CPAP used?- [x]  No []  Yes  (Instruct to bring their mask & Tubing)  Does the patient monitor blood sugar? []  No []  Yes  []  N/A Does patient have a 03-02-2022 or Dexacom? []  No []  Yes   Fasting Blood Sugar Ranges-  Checks Blood Sugar _____ times a day  Blood Thinner Instructions: Aspirin Instructions: Last Dose:  ERAS Protocol Ordered: []  No  []  Yes PRE-SURGERY []  ENSURE  []  G2   Comments:   Activity level: Patient can / can not climb a flight of stairs without difficulty;  []  No CP  []  No SOB,  but would have ______   Anesthesia review:   Patient denies shortness of breath, fever, cough and chest pain at PAT appointment.  Patient verbalized understanding and agreement to the Pre-Surgical Instructions that were given to them at this PAT appointment. Patient was also educated of the need to review these PAT instructions again prior to his/her surgery.I reviewed the appropriate phone numbers to call if they have any and questions or concerns.

## 2022-02-26 ENCOUNTER — Encounter (HOSPITAL_COMMUNITY)
Admission: RE | Admit: 2022-02-26 | Discharge: 2022-02-26 | Disposition: A | Discharge status: Home / Self Care | Payer: Self-pay | Source: Ambulatory Visit | Attending: Orthopedic Surgery | Admitting: Orthopedic Surgery

## 2022-02-26 ENCOUNTER — Other Ambulatory Visit: Payer: Self-pay

## 2022-02-26 ENCOUNTER — Encounter (HOSPITAL_COMMUNITY): Payer: Self-pay

## 2022-02-26 VITALS — BP 139/90 | HR 72 | Temp 98.2°F | Resp 14 | Ht 70.0 in | Wt 142.0 lb

## 2022-02-26 DIAGNOSIS — F1910 Other psychoactive substance abuse, uncomplicated: Secondary | ICD-10-CM | POA: Insufficient documentation

## 2022-02-26 DIAGNOSIS — Z01812 Encounter for preprocedural laboratory examination: Secondary | ICD-10-CM | POA: Insufficient documentation

## 2022-02-26 DIAGNOSIS — Z01818 Encounter for other preprocedural examination: Secondary | ICD-10-CM

## 2022-02-26 LAB — COMPREHENSIVE METABOLIC PANEL
ALT: 15 U/L (ref 0–44)
AST: 17 U/L (ref 15–41)
Albumin: 4.8 g/dL (ref 3.5–5.0)
Alkaline Phosphatase: 83 U/L (ref 38–126)
Anion gap: 7 (ref 5–15)
BUN: 11 mg/dL (ref 6–20)
CO2: 25 mmol/L (ref 22–32)
Calcium: 9.4 mg/dL (ref 8.9–10.3)
Chloride: 108 mmol/L (ref 98–111)
Creatinine, Ser: 0.79 mg/dL (ref 0.61–1.24)
GFR, Estimated: >60 mL/min (ref >60)
Glucose, Bld: 96 mg/dL (ref 70–99)
Potassium: 3.9 mmol/L (ref 3.5–5.1)
Sodium: 140 mmol/L (ref 135–145)
Total Bilirubin: 0.7 mg/dL (ref 0.3–1.2)
Total Protein: 7.8 g/dL (ref 6.5–8.1)

## 2022-02-26 NOTE — Anesthesia Preprocedure Evaluation (Signed)
Anesthesia Evaluation  Patient identified by MRN, date of birth, ID band Patient awake    Reviewed: Allergy & Precautions, NPO status , Patient's Chart, lab work & pertinent test results  Airway Mallampati: I  TM Distance: >3 FB Neck ROM: Full    Dental  (+) Dental Advisory Given   Pulmonary COPD, Current Smoker and Patient abstained from smoking.,    Pulmonary exam normal breath sounds clear to auscultation       Cardiovascular Normal cardiovascular exam Rhythm:Regular Rate:Normal     Neuro/Psych PSYCHIATRIC DISORDERS Depression    GI/Hepatic (+)     substance abuse  ,   Endo/Other    Renal/GU      Musculoskeletal   Abdominal   Peds  Hematology   Anesthesia Other Findings   Reproductive/Obstetrics                            Anesthesia Physical Anesthesia Plan  ASA: 2  Anesthesia Plan: MAC   Post-op Pain Management: Precedex and Minimal or no pain anticipated   Induction: Intravenous  PONV Risk Score and Plan: 1 and Treatment may vary due to age or medical condition, Propofol infusion, TIVA and Midazolam  Airway Management Planned: Natural Airway, Nasal Cannula and Simple Face Mask  Additional Equipment: None  Intra-op Plan:   Post-operative Plan:   Informed Consent: I have reviewed the patients History and Physical, chart, labs and discussed the procedure including the risks, benefits and alternatives for the proposed anesthesia with the patient or authorized representative who has indicated his/her understanding and acceptance.     Dental advisory given  Plan Discussed with: CRNA  Anesthesia Plan Comments:        Anesthesia Quick Evaluation

## 2022-02-26 NOTE — Patient Instructions (Signed)
DUE TO SPACE LIMITATIONS, ONLY TWO VISITORS  (aged 28 and older) ARE ALLOWED TO COME WITH YOU AND STAY IN THE WAITING ROOM DURING YOUR PRE OP AND PROCEDURE.   **NO VISITORS ARE ALLOWED IN THE SHORT STAY AREA OR RECOVERY ROOM!!**  You are not required to quarantine at this time prior to your surgery. However, you must do this: Hand Hygiene often Do NOT share personal items Notify your provider if you are in close contact with someone who has COVID or you develop fever 100.4 or greater, new onset of sneezing, cough, sore throat, shortness of breath or body aches.       Your procedure is scheduled on:  Tuesday March 30, 2022  Report to [facility] Main Entrance.  Report to admitting at:  10:15   AM  +++++Call this number if you have any questions or problems the morning of surgery [phone]  Do not eat food :After Midnight the night prior to your surgery/procedure.  After Midnight you may have the following liquids until 09:30 AM DAY OF SURGERY  Clear Liquid Diet Water Black Coffee (sugar ok, NO MILK/CREAM OR CREAMERS)  Tea (sugar ok, NO MILK/CREAM OR CREAMERS) regular and decaf                             Plain Jell-O (NO RED)                                           Fruit ices (not with fruit pulp, NO RED)                                     Popsicles (NO RED)                                                                  Juice: apple, WHITE grape, WHITE cranberry Sports drinks like Gatorade (NO RED)                 FOLLOW ANY ADDITIONAL PRE OP INSTRUCTIONS YOU RECEIVED FROM YOUR SURGEON'S OFFICE!!!   Oral Hygiene is also important to reduce your risk of infection.        Remember - BRUSH YOUR TEETH THE MORNING OF SURGERY WITH YOUR REGULAR TOOTHPASTE  Do NOT smoke after Midnight the night before surgery.  Take ONLY these medicines the morning of surgery with A SIP OF WATER:   if needed, Tylenol and Methocarbamol (Robaxin)                   You may not have  any metal on your body including jewelry, and body piercing  Do not wear lotions, powders, cologne, or deodorant  Men may shave face and neck.  Contacts, Hearing Aids, dentures or bridgework may not be worn into surgery.   DO NOT BRING YOUR HOME MEDICATIONS TO THE HOSPITAL. PHARMACY WILL DISPENSE MEDICATIONS LISTED ON YOUR MEDICATION LIST TO YOU DURING YOUR ADMISSION IN THE HOSPITAL!   Patients discharged on the day of surgery will not be  allowed to drive home.  Someone NEEDS to stay with you for the first 24 hours after anesthesia.  Special Instructions: Bring a copy of your healthcare power of attorney and living will documents the day of surgery, if you wish to have them scanned into your [facility] Records- EPIC  Please read over the following fact sheets you were given: IF YOU HAVE QUESTIONS ABOUT YOUR PRE-OP INSTRUCTIONS, PLEASE CALL [phone]  (KAY)   [facility] - Preparing for Surgery Before surgery, you can play an important role.  Because skin is not sterile, your skin needs to be as free of germs as possible.  You can reduce the number of germs on your skin by washing with CHG (chlorahexidine gluconate) soap before surgery.  CHG is an antiseptic cleaner which kills germs and bonds with the skin to continue killing germs even after washing. Please DO NOT use if you have an allergy to CHG or antibacterial soaps.  If your skin becomes reddened/irritated stop using the CHG and inform your nurse when you arrive at Short Stay. Do not shave (including legs and underarms) for at least 48 hours prior to the first CHG shower.  You may shave your face/neck.  Please follow these instructions carefully:  1.  Shower with CHG Soap the night before surgery and the  morning of surgery.  2.  If you choose to wash your hair, wash your hair first as usual with your normal  shampoo.  3.  After you shampoo, rinse your hair and body thoroughly to remove the shampoo.                              4.  Use CHG as you would any other liquid soap.  You can apply chg directly to the skin and wash.  Gently with a scrungie or clean washcloth.  5.  Apply the CHG Soap to your body ONLY FROM THE NECK DOWN.   Do not use on face/ open                           Wound or open sores. Avoid contact with eyes, ears mouth and genitals (private parts).                       Wash face,  Genitals (private parts) with your normal soap.             6.  Wash thoroughly, paying special attention to the area where your  surgery  will be performed.  7.  Thoroughly rinse your body with warm water from the neck down.  8.  DO NOT shower/wash with your normal soap after using and rinsing off the CHG Soap.            9.  Pat yourself dry with a clean towel.            10.  Wear clean pajamas.            11.  Place clean sheets on your bed the night of your first shower and do not  sleep with pets.  ON THE DAY OF SURGERY : Do not apply any lotions/deodorants the morning of surgery.  Please wear clean clothes to the hospital/surgery center.    FAILURE TO FOLLOW THESE INSTRUCTIONS MAY RESULT IN THE CANCELLATION OF YOUR SURGERY  PATIENT SIGNATURE_________________________________  NURSE SIGNATURE__________________________________  ________________________________________________________________________

## 2022-02-27 ENCOUNTER — Other Ambulatory Visit (HOSPITAL_COMMUNITY): Payer: Self-pay

## 2022-02-27 ENCOUNTER — Ambulatory Visit (HOSPITAL_COMMUNITY): Payer: Self-pay | Admitting: Anesthesiology

## 2022-02-27 ENCOUNTER — Ambulatory Visit (HOSPITAL_COMMUNITY)
Admission: RE | Admit: 2022-02-27 | Discharge: 2022-02-27 | Disposition: A | Discharge status: Home / Self Care | Payer: Self-pay | Attending: Orthopedic Surgery | Admitting: Orthopedic Surgery

## 2022-02-27 ENCOUNTER — Other Ambulatory Visit: Payer: Self-pay

## 2022-02-27 ENCOUNTER — Encounter (HOSPITAL_COMMUNITY)
Admission: RE | Disposition: A | Discharge status: Home / Self Care | Payer: Self-pay | Source: Home / Self Care | Attending: Orthopedic Surgery

## 2022-02-27 ENCOUNTER — Encounter (HOSPITAL_COMMUNITY): Payer: Self-pay | Admitting: Orthopedic Surgery

## 2022-02-27 ENCOUNTER — Ambulatory Visit (HOSPITAL_BASED_OUTPATIENT_CLINIC_OR_DEPARTMENT_OTHER): Payer: Self-pay | Admitting: Anesthesiology

## 2022-02-27 DIAGNOSIS — J449 Chronic obstructive pulmonary disease, unspecified: Secondary | ICD-10-CM

## 2022-02-27 DIAGNOSIS — S62101A Fracture of unspecified carpal bone, right wrist, initial encounter for closed fracture: Secondary | ICD-10-CM

## 2022-02-27 DIAGNOSIS — Z472 Encounter for removal of internal fixation device: Secondary | ICD-10-CM

## 2022-02-27 DIAGNOSIS — Z01818 Encounter for other preprocedural examination: Secondary | ICD-10-CM

## 2022-02-27 DIAGNOSIS — J439 Emphysema, unspecified: Secondary | ICD-10-CM | POA: Insufficient documentation

## 2022-02-27 DIAGNOSIS — F17210 Nicotine dependence, cigarettes, uncomplicated: Secondary | ICD-10-CM

## 2022-02-27 DIAGNOSIS — F1910 Other psychoactive substance abuse, uncomplicated: Secondary | ICD-10-CM

## 2022-02-27 DIAGNOSIS — F32A Depression, unspecified: Secondary | ICD-10-CM | POA: Insufficient documentation

## 2022-02-27 DIAGNOSIS — Z4589 Encounter for adjustment and management of other implanted devices: Secondary | ICD-10-CM | POA: Insufficient documentation

## 2022-02-27 DIAGNOSIS — F17200 Nicotine dependence, unspecified, uncomplicated: Secondary | ICD-10-CM | POA: Insufficient documentation

## 2022-02-27 HISTORY — PX: HARDWARE REMOVAL: SHX979

## 2022-02-27 LAB — RAPID URINE DRUG SCREEN, HOSP PERFORMED
Amphetamines: NOT DETECTED
Barbiturates: NOT DETECTED
Benzodiazepines: NOT DETECTED
Cocaine: NOT DETECTED
Opiates: NOT DETECTED
Tetrahydrocannabinol: POSITIVE — AB

## 2022-02-27 SURGERY — REMOVAL, HARDWARE
Anesthesia: Monitor Anesthesia Care | Laterality: Right

## 2022-02-27 MED ORDER — MIDAZOLAM HCL 5 MG/5ML IJ SOLN
INTRAMUSCULAR | Status: DC | PRN
  Administered 2022-02-27: 2 mg via INTRAVENOUS

## 2022-02-27 MED ORDER — CEFAZOLIN SODIUM-DEXTROSE 2-4 GM/100ML-% IV SOLN
2.0000 g | INTRAVENOUS | Status: AC
  Administered 2022-02-27: 2 g via INTRAVENOUS
  Filled 2022-02-27: qty 100

## 2022-02-27 MED ORDER — HYDROMORPHONE HCL 1 MG/ML IJ SOLN: 0.2500 mg | INTRAMUSCULAR | Status: DC | PRN

## 2022-02-27 MED ORDER — LACTATED RINGERS IV SOLN: INTRAVENOUS | Status: DC

## 2022-02-27 MED ORDER — PROPOFOL 500 MG/50ML IV EMUL
INTRAVENOUS | Status: DC | PRN
  Administered 2022-02-27: 40 mg via INTRAVENOUS
  Administered 2022-02-27: 75 ug/kg/min via INTRAVENOUS

## 2022-02-27 MED ORDER — CHLORHEXIDINE GLUCONATE 0.12 % MT SOLN
15.0000 mL | Freq: Once | OROMUCOSAL | Status: AC
Start: 2022-02-27 — End: 2022-02-27
  Administered 2022-02-27: 15 mL via OROMUCOSAL

## 2022-02-27 MED ORDER — LIDOCAINE HCL (PF) 1 % IJ SOLN
INTRAMUSCULAR | Status: AC
  Filled 2022-02-27: qty 30

## 2022-02-27 MED ORDER — 0.9 % SODIUM CHLORIDE (POUR BTL) OPTIME
TOPICAL | Status: DC | PRN
  Administered 2022-02-27: 1000 mL

## 2022-02-27 MED ORDER — OXYCODONE HCL 5 MG PO TABS: 5.0000 mg | ORAL_TABLET | Freq: Once | ORAL | Status: DC | PRN

## 2022-02-27 MED ORDER — MIDAZOLAM HCL 2 MG/2ML IJ SOLN: 2.0000 mg | INTRAMUSCULAR | Status: DC

## 2022-02-27 MED ORDER — BUPIVACAINE HCL (PF) 0.5 % IJ SOLN
INTRAMUSCULAR | Status: AC
  Filled 2022-02-27: qty 30

## 2022-02-27 MED ORDER — OXYCODONE HCL 5 MG/5ML PO SOLN: 5.0000 mg | Freq: Once | ORAL | Status: DC | PRN

## 2022-02-27 MED ORDER — FENTANYL CITRATE (PF) 100 MCG/2ML IJ SOLN
INTRAMUSCULAR | Status: AC
  Filled 2022-02-27: qty 2

## 2022-02-27 MED ORDER — ORAL CARE MOUTH RINSE: 15.0000 mL | Freq: Once | OROMUCOSAL | Status: AC

## 2022-02-27 MED ORDER — MIDAZOLAM HCL 2 MG/2ML IJ SOLN
INTRAMUSCULAR | Status: AC
  Filled 2022-02-27: qty 2

## 2022-02-27 MED ORDER — LIDOCAINE HCL 1 % IJ SOLN
INTRAMUSCULAR | Status: DC | PRN
  Administered 2022-02-27: 20 mL via INTRAMUSCULAR

## 2022-02-27 MED ORDER — ONDANSETRON HCL 4 MG/2ML IJ SOLN: 4.0000 mg | Freq: Once | INTRAMUSCULAR | Status: DC | PRN

## 2022-02-27 MED ORDER — FENTANYL CITRATE PF 50 MCG/ML IJ SOSY: 100.0000 ug | PREFILLED_SYRINGE | INTRAMUSCULAR | Status: DC

## 2022-02-27 MED ORDER — KETOROLAC TROMETHAMINE 30 MG/ML IJ SOLN: 30.0000 mg | Freq: Once | INTRAMUSCULAR | Status: DC | PRN

## 2022-02-27 MED ORDER — FENTANYL CITRATE (PF) 100 MCG/2ML IJ SOLN
INTRAMUSCULAR | Status: DC | PRN
  Administered 2022-02-27: 100 ug via INTRAVENOUS

## 2022-02-27 MED ORDER — OXYCODONE-ACETAMINOPHEN 5-325 MG PO TABS
1.0000 | ORAL_TABLET | Freq: Four times a day (QID) | ORAL | 0 refills | Status: AC | PRN
  Filled 2022-02-27: qty 10, 3d supply, fill #0

## 2022-02-27 SURGICAL SUPPLY — 51 items
APL PRP STRL LF DISP 70% ISPRP (MISCELLANEOUS)
BAG COUNTER SPONGE SURGICOUNT (BAG) IMPLANT
BAG SPEC THK2 15X12 ZIP CLS (MISCELLANEOUS)
BAG SPNG CNTER NS LX DISP (BAG)
BAG ZIPLOCK 12X15 (MISCELLANEOUS) ×2 IMPLANT
BLADE OSCILLATING/SAGITTAL (BLADE)
BLADE SW THK.38XMED LNG THN (BLADE) ×2 IMPLANT
BNDG ELASTIC 4X5.8 VLCR STR LF (GAUZE/BANDAGES/DRESSINGS) IMPLANT
BNDG GAUZE DERMACEA FLUFF 4 (GAUZE/BANDAGES/DRESSINGS) ×2 IMPLANT
BNDG GZE DERMACEA 4 6PLY (GAUZE/BANDAGES/DRESSINGS) ×1
CHLORAPREP W/TINT 26 (MISCELLANEOUS) ×2 IMPLANT
CORD BIPOLAR FORCEPS 12FT (ELECTRODE) ×4 IMPLANT
COVER SURGICAL LIGHT HANDLE (MISCELLANEOUS) ×2 IMPLANT
CUFF TOURN SGL QUICK 24 (TOURNIQUET CUFF) ×1
CUFF TOURN SGL QUICK 34 (TOURNIQUET CUFF)
CUFF TRNQT CYL 24X4X16.5-23 (TOURNIQUET CUFF) IMPLANT
CUFF TRNQT CYL 34X4.125X (TOURNIQUET CUFF) ×2 IMPLANT
DRAPE C-ARM 42X120 X-RAY (DRAPES) ×2 IMPLANT
DRAPE OEC MINIVIEW 54X84 (DRAPES) ×2 IMPLANT
DRAPE SHEET LG 3/4 BI-LAMINATE (DRAPES) ×2 IMPLANT
DRAPE U-SHAPE 47X51 STRL (DRAPES) ×2 IMPLANT
DRSG ADAPTIC 3X8 NADH LF (GAUZE/BANDAGES/DRESSINGS) ×2 IMPLANT
ELECT REM PT RETURN 15FT ADLT (MISCELLANEOUS) ×2 IMPLANT
GAUZE PAD ABD 8X10 STRL (GAUZE/BANDAGES/DRESSINGS) ×4 IMPLANT
GAUZE SPONGE 4X4 12PLY STRL (GAUZE/BANDAGES/DRESSINGS) ×4 IMPLANT
GLOVE BIO SURGEON STRL SZ7.5 (GLOVE) ×4 IMPLANT
GLOVE BIO SURGEON STRL SZ8 (GLOVE) ×2 IMPLANT
GLOVE BIOGEL PI IND STRL 7.5 (GLOVE) ×2 IMPLANT
GLOVE BIOGEL PI INDICATOR 7.5 (GLOVE) ×1
KIT TURNOVER KIT A (KITS) IMPLANT
MANIFOLD NEPTUNE II (INSTRUMENTS) ×2 IMPLANT
NEEDLE HYPO 22GX1.5 SAFETY (NEEDLE) ×2 IMPLANT
PACK ORTHO EXTREMITY (CUSTOM PROCEDURE TRAY) ×2 IMPLANT
PAD CAST 4YDX4 CTTN HI CHSV (CAST SUPPLIES) ×4 IMPLANT
PADDING CAST COTTON 4X4 STRL (CAST SUPPLIES) ×1
PENCIL SMOKE EVACUATOR (MISCELLANEOUS) IMPLANT
PROTECTOR NERVE ULNAR (MISCELLANEOUS) ×2 IMPLANT
SUT CHROMIC 4 0 PS 2 18 (SUTURE) IMPLANT
SUT ETHILON 3 0 PS 1 (SUTURE) ×2 IMPLANT
SUT FIBERWIRE #2 38 T-5 BLUE (SUTURE)
SUT FIBERWIRE 2-0 18 17.9 3/8 (SUTURE)
SUT MERSILENE 4 0 P 3 (SUTURE) ×2 IMPLANT
SUT PROLENE 3 0 PS 2 (SUTURE) ×2 IMPLANT
SUT SILK 2 0 (SUTURE)
SUT SILK 2-0 18XBRD TIE 12 (SUTURE) ×2 IMPLANT
SUT VIC AB 2-0 CT1 27 (SUTURE)
SUT VIC AB 2-0 CT1 TAPERPNT 27 (SUTURE) ×2 IMPLANT
SUTURE FIBERWR #2 38 T-5 BLUE (SUTURE) ×2 IMPLANT
SUTURE FIBERWR 2-0 18 17.9 3/8 (SUTURE) ×2 IMPLANT
SYR CONTROL 10ML LL (SYRINGE) ×2 IMPLANT
TOWEL OR 17X26 10 PK STRL BLUE (TOWEL DISPOSABLE) ×2 IMPLANT

## 2022-02-27 NOTE — Transfer of Care (Signed)
Immediate Anesthesia Transfer of Care Note  Patient: Jaycion Treml [facility]  Procedure(s) Performed: HARDWARE REMOVAL (Right)  Patient Location: PACU  Anesthesia Type:MAC  Level of Consciousness: awake, alert  and oriented  Airway & Oxygen Therapy: Patient Spontanous Breathing and Patient connected to face mask oxygen  Post-op Assessment: Report given to RN and Post -op Vital signs reviewed and stable  Post vital signs: Reviewed and stable  Last Vitals:  Vitals Value Taken Time  BP 108/71 02/27/22 1208  Temp    Pulse 61 02/27/22 1210  Resp 15 02/27/22 1210  SpO2 100 % 02/27/22 1210  Vitals shown include unvalidated device data.  Last Pain:  Vitals:   02/27/22 1104  TempSrc: Oral         Complications: No notable events documented.

## 2022-02-27 NOTE — Discharge Instructions (Signed)
  Orthopaedic Hand Surgery Discharge Instructions  WEIGHT BEARING STATUS: Non weight bearing on operative extremity  DRESSING CARE: Please keep your dressing/splint/cast clean and dry until your follow-up appointment. You may shower by placing a waterproof covering over your dressing/splint/cast. Contact your surgeon if your splint/cast gets wet. It will need to be changed to prevent skin breakdown.  PAIN CONTROL: First line medications for post operative pain control are Tylenol (acetaminophen) and Motrin (ibuprofen) if you are able to take these medications. If you have been prescribed a medication these can be taken as breakthrough pain medications. Please note that some narcotic pain medication has acetaminophen added and you should never consume more than 4,000mg of acetaminophen in 24-hour period. Please note that if you are given Toradol (ketorolac) you should not take similar medications such as ibuprofen or naproxen.  DISCHARGE MEDICATIONS: If you have been prescribed medication it was sent electronically to your pharmacy. No changes have been made to your home medications.  ICE/ELEVATION: Ice and elevate your injured extremity as needed. Avoid direct contact of ice with skin.   BANDAGE FEELS TOO TIGHT: If your bandage feels too tight, first make sure you are elevating your fingers as much as possible. The outer layer of the bandage can be unwrapped and reapplied more loosely. If no improvement, you may carefully cut the inner layer longitudinally until the pressure has resolved and then rewrap the outer layer. If you are not comfortable with these instructions, please call the office and the bandage can be changed for you.   FOLLOW UP: You will be called after surgery with an appointment date and time, however if you have not received a phone call within 3 days, please call during regular office hours at [phone] to schedule a post operative appointment.  Please Seek Medical Attention  if: Call MD for: pain or pressure in chest, jaw, arm, back, neck  Call MD for: temperature greater than 101 F for more than 24 hrs Call MD for: difficulty breathing Call MD for: incision redness, bleeding, drainage  Call MD for: palpitations or feeling that the heart is racing  Call MD for: increased swelling in arm, leg, ankle, or abdomen  Call MD for: lightheadedness, dizziness, fainting Call 911 or go to ER for any medical emergency if you are not able to get in touch with your doctor   J. Reid Nike Southers, MD Orthopaedic Hand Surgeon [facility] Office number: [phone] [address] [place], [place] [address]  

## 2022-02-27 NOTE — OR Nursing (Signed)
PLATE X 1 REMOVED FROM AND GIVEN TO PATIENT AT END OF CASE PER DR SPEARS.
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
## 2022-02-27 NOTE — Anesthesia Postprocedure Evaluation (Signed)
Anesthesia Post Note  Patient: Mathew Herrera [facility]  Procedure(s) Performed: HARDWARE REMOVAL (Right)     Patient location during evaluation: PACU Anesthesia Type: MAC Level of consciousness: awake and alert Pain management: pain level controlled Vital Signs Assessment: post-procedure vital signs reviewed and stable Respiratory status: spontaneous breathing Cardiovascular status: stable Anesthetic complications: no   No notable events documented.  Last Vitals:  Vitals:   02/27/22 1230 02/27/22 1245  BP: 129/85 129/80  Pulse: (!) 55 (!) 54  Resp: 12 13  Temp:    SpO2: 100% 100%    Last Pain:  Vitals:   02/27/22 1245  TempSrc:   PainSc: 0-No pain                 Nolon Nations

## 2022-02-28 ENCOUNTER — Encounter (HOSPITAL_COMMUNITY): Payer: Self-pay | Admitting: Orthopedic Surgery

## 2022-02-28 NOTE — Op Note (Signed)
OPERATIVE NOTE  DATE OF PROCEDURE: 02/27/2022  SURGEONS:  Primary: Orene Desanctis, MD  PREOPERATIVE DIAGNOSIS: Right wrist fracture with hardware in place  POSTOPERATIVE DIAGNOSIS: Same  NAME OF PROCEDURE:   Right wrist removal of hardware- dorsal spanning plate and screws 4 view radiographs of the right wrist with intraoperative interpretation.  ANESTHESIA: Monitor Anesthesia Care + Local  SKIN PREPARATION: Hibiclens  ESTIMATED BLOOD LOSS: Minimal  IMPLANTS: none  EXPLANTS: Biomet dorsal spanning plate and screws  INDICATIONS:  Mathew Herrera is a 26 y.o. male who has the above preoperative diagnosis. The patient has decided to proceed with surgical intervention.  Risks, benefits and alternatives of operative management were discussed including, but not limited to, risks of anesthesia complications, infection, pain, persistent symptoms, stiffness, need for future surgery.  The patient understands, agrees and elects to proceed with surgery.  The patient is 8-1/2 weeks out from the original surgery and has excellent consolidation at the fracture site however my plan was to leave this plate in for another 2 to 4 weeks however the patient is likely going to prison and would like to have the plate removed prior to proceeding with his court date as he feels as though he will likely go to prison for up to 6 months directly from his court date.  We discussed the pros and cons of early plate removal and he elected to proceed.  DESCRIPTION OF PROCEDURE: The patient was met in the pre-operative area and their identity was verified.  The operative location and laterality was also verified and marked.  The patient was brought to the OR and was placed supine on the table.  After repeat patient identification with the operative team anesthesia was provided and the patient was prepped and draped in the usual sterile fashion.  A final timeout was performed verifying the correction patient, procedure, location and  laterality.  The right upper extremity was elevated exsanguinated with an Esmarch and tourniquet inflated to 250 mmHg.  Local anesthesia was infiltrated over the second metacarpal and radial shaft.  Longitudinal incisions were made through the previous scars.  Skin and subcutaneous tissues were divided and careful hemostasis was obtained with bipolar.  The extensor tendons were mobilized and the plate was identified both proximally and distally.  All screws were removed.  The plate was loosened with a Freer and slid out through the distal incision.  The wrist was brought through full range of motion and the radius moved as 1 unit.  4 view radiographs of the right wrist shows good alignment of the wrist joint.  Proximally the comminution of the radius has consolidated however there is still some fracture line that is persistent.  The wrist and fingers were brought through range of motion and these were free with full range of motion.  The wounds were thoroughly irrigated the screw holes were curetted and again irrigated the skin was closed with 4-0 chromic suture in horizontal mattress fashion.  Sterile soft bandage was applied.  The patient was awoken from anesthesia and brought to PACU for recovery in stable condition.  All counts were correct x2.   Matt Holmes, MD

## 2022-03-06 DIAGNOSIS — Z01818 Encounter for other preprocedural examination: Secondary | ICD-10-CM

## 2022-03-06 DIAGNOSIS — F1910 Other psychoactive substance abuse, uncomplicated: Secondary | ICD-10-CM

## 2022-03-06 HISTORY — DX: Cannabis abuse, uncomplicated: F12.10

## 2022-03-06 HISTORY — DX: Other injury of heart without hemopericardium, initial encounter: S26.19XA

## 2022-03-06 HISTORY — DX: Presence of spectacles and contact lenses: Z97.3

## 2022-03-06 HISTORY — DX: Pain due to internal orthopedic prosthetic devices, implants and grafts, initial encounter: T84.84XA

## 2022-03-06 HISTORY — DX: Major depressive disorder, single episode, unspecified: F32.9

## 2022-03-06 HISTORY — DX: Other psychoactive substance abuse, uncomplicated: F19.10

## 2022-03-06 HISTORY — DX: Localized swelling, mass and lump, right lower limb: R22.41

## 2022-03-06 HISTORY — DX: Emphysema, unspecified: J43.9

## 2022-03-06 HISTORY — DX: Personal history of traumatic brain injury: Z87.820

## 2023-06-20 ENCOUNTER — Emergency Department (HOSPITAL_COMMUNITY): Payer: MEDICAID

## 2023-06-20 ENCOUNTER — Other Ambulatory Visit: Payer: Self-pay

## 2023-06-20 ENCOUNTER — Emergency Department (HOSPITAL_COMMUNITY)
Admission: EM | Admit: 2023-06-20 | Discharge: 2023-06-20 | Disposition: A | Discharge status: Home / Self Care | Payer: MEDICAID | Attending: Emergency Medicine | Admitting: Emergency Medicine

## 2023-06-20 DIAGNOSIS — S59901A Unspecified injury of right elbow, initial encounter: Secondary | ICD-10-CM | POA: Diagnosis present

## 2023-06-20 DIAGNOSIS — S9001XA Contusion of right ankle, initial encounter: Secondary | ICD-10-CM

## 2023-06-20 DIAGNOSIS — Z23 Encounter for immunization: Secondary | ICD-10-CM | POA: Diagnosis not present

## 2023-06-20 DIAGNOSIS — Y92410 Unspecified street and highway as the place of occurrence of the external cause: Secondary | ICD-10-CM | POA: Diagnosis not present

## 2023-06-20 DIAGNOSIS — S51011A Laceration without foreign body of right elbow, initial encounter: Secondary | ICD-10-CM | POA: Diagnosis not present

## 2023-06-20 DIAGNOSIS — S81812A Laceration without foreign body, left lower leg, initial encounter: Secondary | ICD-10-CM | POA: Diagnosis not present

## 2023-06-20 DIAGNOSIS — S81811A Laceration without foreign body, right lower leg, initial encounter: Secondary | ICD-10-CM | POA: Insufficient documentation

## 2023-06-20 LAB — I-STAT CHEM 8, ED
BUN: 12 mg/dL (ref 6–20)
Calcium, Ion: 0.92 mmol/L — ABNORMAL LOW (ref 1.15–1.40)
Chloride: 108 mmol/L (ref 98–111)
Creatinine, Ser: 0.8 mg/dL (ref 0.61–1.24)
Glucose, Bld: 95 mg/dL (ref 70–99)
HCT: 47 % (ref 39.0–52.0)
Hemoglobin: 16 g/dL (ref 13.0–17.0)
Potassium: 7 mmol/L (ref 3.5–5.1)
Sodium: 135 mmol/L (ref 135–145)
TCO2: 24 mmol/L (ref 22–32)

## 2023-06-20 LAB — CBC WITH DIFFERENTIAL/PLATELET
Abs Immature Granulocytes: 0.04 10*3/uL (ref 0.00–0.07)
Basophils Absolute: 0.1 10*3/uL (ref 0.0–0.1)
Basophils Relative: 1 %
Eosinophils Absolute: 0.1 10*3/uL (ref 0.0–0.5)
Eosinophils Relative: 1 %
HCT: 46.2 % (ref 39.0–52.0)
Hemoglobin: 16.2 g/dL (ref 13.0–17.0)
Immature Granulocytes: 0 %
Lymphocytes Relative: 14 %
Lymphs Abs: 1.7 10*3/uL (ref 0.7–4.0)
MCH: 32.6 pg (ref 26.0–34.0)
MCHC: 35.1 g/dL (ref 30.0–36.0)
MCV: 93 fL (ref 80.0–100.0)
Monocytes Absolute: 0.6 10*3/uL (ref 0.1–1.0)
Monocytes Relative: 5 %
Neutro Abs: 9.4 10*3/uL — ABNORMAL HIGH (ref 1.7–7.7)
Neutrophils Relative %: 79 %
Platelets: 250 10*3/uL (ref 150–400)
RBC: 4.97 MIL/uL (ref 4.22–5.81)
RDW: 11.3 % — ABNORMAL LOW (ref 11.5–15.5)
WBC: 11.9 10*3/uL — ABNORMAL HIGH (ref 4.0–10.5)
nRBC: 0 % (ref 0.0–0.2)

## 2023-06-20 LAB — ABO/RH: ABO/RH(D): O NEG

## 2023-06-20 LAB — TYPE AND SCREEN
ABO/RH(D): O NEG
Antibody Screen: NEGATIVE

## 2023-06-20 LAB — BASIC METABOLIC PANEL
Anion gap: 7 (ref 5–15)
BUN: 8 mg/dL (ref 6–20)
CO2: 24 mmol/L (ref 22–32)
Calcium: 8.7 mg/dL — ABNORMAL LOW (ref 8.9–10.3)
Chloride: 106 mmol/L (ref 98–111)
Creatinine, Ser: 0.81 mg/dL (ref 0.61–1.24)
GFR, Estimated: >60 mL/min (ref >60)
Glucose, Bld: 93 mg/dL (ref 70–99)
Potassium: 3.9 mmol/L (ref 3.5–5.1)
Sodium: 137 mmol/L (ref 135–145)

## 2023-06-20 LAB — APTT: aPTT: 28 s (ref 24–36)

## 2023-06-20 MED ORDER — CEFTRIAXONE SODIUM 500 MG IJ SOLR
500.0000 mg | Freq: Once | INTRAMUSCULAR | Status: AC
  Administered 2023-06-20: 500 mg via INTRAMUSCULAR
  Filled 2023-06-20: qty 500

## 2023-06-20 MED ORDER — HYDROCODONE-ACETAMINOPHEN 5-325 MG PO TABS
1.0000 | ORAL_TABLET | Freq: Once | ORAL | Status: AC
  Administered 2023-06-20: 1 via ORAL
  Filled 2023-06-20: qty 1

## 2023-06-20 MED ORDER — LIDOCAINE HCL (PF) 1 % IJ SOLN
30.0000 mL | Freq: Once | INTRAMUSCULAR | Status: AC
  Administered 2023-06-20: 30 mL via INTRADERMAL
  Filled 2023-06-20: qty 30

## 2023-06-20 MED ORDER — TETANUS-DIPHTH-ACELL PERTUSSIS 5-2.5-18.5 LF-MCG/0.5 IM SUSY
0.5000 mL | PREFILLED_SYRINGE | Freq: Once | INTRAMUSCULAR | Status: AC
  Administered 2023-06-20: 0.5 mL via INTRAMUSCULAR
  Filled 2023-06-20: qty 0.5

## 2023-06-20 MED ORDER — STERILE WATER FOR INJECTION IJ SOLN
INTRAMUSCULAR | Status: AC
  Filled 2023-06-20: qty 10

## 2023-06-20 MED ORDER — IOHEXOL 350 MG/ML SOLN
75.0000 mL | Freq: Once | INTRAVENOUS | Status: AC | PRN
  Administered 2023-06-20: 75 mL via INTRAVENOUS

## 2023-06-20 NOTE — ED Provider Notes (Signed)
Laceration repair note.  There was a 2.4 cm laceration on the patient's right elbow.  I anesthetized the area using 10 cc of 1% lidocaine without epinephrine.  Irrigated the area with 200 cc of sterile saline.  Placed 2 simple interrupted sutures and 1 horizontal mattress suture using 4-0 Prolene.  Hemostasis achieved.  Patient tolerated procedure well.   Anders Simmonds T, DO 06/20/23 1646

## 2023-06-20 NOTE — ED Notes (Signed)
Patient transported to CT with RN 
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
## 2023-06-20 NOTE — ED Notes (Signed)
PD at bedside.
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
## 2023-06-20 NOTE — ED Notes (Signed)
MD notified of laceration to right elbow, lido pulled and sutures placed. Wound dressed.

## 2023-06-20 NOTE — Discharge Instructions (Addendum)
You should use the walking boot and crutches over the next few days.  You can take 1000 mg of Tylenol every 8 hours, 400 mg of Motrin every 6 hours.  I have included a follow-up with an orthopedic doctor if you feel as though your ankle is not getting better over the next 1 week.  Your stitches need to be evaluated within 5 to 7 days by medical professional.  Those stitches will not absorb.
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
## 2023-06-20 NOTE — ED Notes (Signed)
Pt returned from CT with RN.
